# Patient Record
Sex: Female | Born: 1960 | Race: Black or African American | Hispanic: No | Marital: Married | Smoking: Never smoker
Health system: Southern US, Community
[De-identification: ages and names within clinical notes are randomized; demographics above are authoritative.]

## PROBLEM LIST (undated history)

## (undated) DIAGNOSIS — F32A Depression, unspecified: Secondary | ICD-10-CM

## (undated) DIAGNOSIS — R0602 Shortness of breath: Secondary | ICD-10-CM

## (undated) DIAGNOSIS — E785 Hyperlipidemia, unspecified: Secondary | ICD-10-CM

## (undated) DIAGNOSIS — M1990 Unspecified osteoarthritis, unspecified site: Secondary | ICD-10-CM

## (undated) DIAGNOSIS — I10 Essential (primary) hypertension: Secondary | ICD-10-CM

## (undated) DIAGNOSIS — F419 Anxiety disorder, unspecified: Secondary | ICD-10-CM

## (undated) DIAGNOSIS — F329 Major depressive disorder, single episode, unspecified: Secondary | ICD-10-CM

## (undated) DIAGNOSIS — K219 Gastro-esophageal reflux disease without esophagitis: Secondary | ICD-10-CM

## (undated) HISTORY — PX: REPLACEMENT TOTAL KNEE: SUR1224

## (undated) HISTORY — PX: CARPAL TUNNEL RELEASE: SHX101

## (undated) HISTORY — PX: APPENDECTOMY: SHX54

## (undated) HISTORY — PX: KNEE ARTHROSCOPY: SUR90

## (undated) HISTORY — PX: JOINT REPLACEMENT: SHX530

## (undated) HISTORY — PX: ABDOMINAL HYSTERECTOMY: SHX81

## (undated) HISTORY — PX: BUNIONECTOMY: SHX129

---

## 2002-04-13 ENCOUNTER — Ambulatory Visit (HOSPITAL_BASED_OUTPATIENT_CLINIC_OR_DEPARTMENT_OTHER): Admission: RE | Admit: 2002-04-13 | Discharge: 2002-04-13 | Payer: Self-pay | Admitting: Orthopedic Surgery

## 2003-02-03 HISTORY — PX: SHOULDER ARTHROSCOPY: SHX128

## 2003-12-26 ENCOUNTER — Ambulatory Visit (HOSPITAL_COMMUNITY): Admission: RE | Admit: 2003-12-26 | Discharge: 2003-12-26 | Payer: Self-pay | Admitting: Orthopedic Surgery

## 2004-02-19 ENCOUNTER — Ambulatory Visit (HOSPITAL_BASED_OUTPATIENT_CLINIC_OR_DEPARTMENT_OTHER): Admission: RE | Admit: 2004-02-19 | Discharge: 2004-02-19 | Payer: Self-pay | Admitting: Orthopedic Surgery

## 2004-02-19 ENCOUNTER — Ambulatory Visit (HOSPITAL_COMMUNITY): Admission: RE | Admit: 2004-02-19 | Discharge: 2004-02-19 | Payer: Self-pay | Admitting: Orthopedic Surgery

## 2004-04-29 ENCOUNTER — Ambulatory Visit (HOSPITAL_COMMUNITY): Admission: RE | Admit: 2004-04-29 | Discharge: 2004-04-29 | Payer: Self-pay | Admitting: Orthopedic Surgery

## 2004-04-29 ENCOUNTER — Ambulatory Visit (HOSPITAL_BASED_OUTPATIENT_CLINIC_OR_DEPARTMENT_OTHER): Admission: RE | Admit: 2004-04-29 | Discharge: 2004-04-29 | Payer: Self-pay | Admitting: Orthopedic Surgery

## 2004-04-29 ENCOUNTER — Encounter (INDEPENDENT_AMBULATORY_CARE_PROVIDER_SITE_OTHER): Payer: Self-pay | Admitting: *Deleted

## 2005-03-27 ENCOUNTER — Ambulatory Visit (HOSPITAL_COMMUNITY): Admission: RE | Admit: 2005-03-27 | Discharge: 2005-03-27 | Payer: Self-pay | Admitting: Orthopedic Surgery

## 2005-05-21 ENCOUNTER — Ambulatory Visit (HOSPITAL_BASED_OUTPATIENT_CLINIC_OR_DEPARTMENT_OTHER): Admission: RE | Admit: 2005-05-21 | Discharge: 2005-05-22 | Payer: Self-pay | Admitting: Orthopedic Surgery

## 2008-11-07 ENCOUNTER — Encounter: Admission: RE | Admit: 2008-11-07 | Discharge: 2008-11-07 | Payer: Self-pay | Admitting: Orthopedic Surgery

## 2008-11-12 ENCOUNTER — Inpatient Hospital Stay (HOSPITAL_COMMUNITY): Admission: RE | Admit: 2008-11-12 | Discharge: 2008-11-14 | Payer: Self-pay | Admitting: Orthopedic Surgery

## 2010-02-04 IMAGING — CT CT CHEST W/O CM
2 of 3 series · 15 of 36 positions shown, 18 images · non-contrast
Comparison: Chest x-ray 11/06/2008

CLINICAL DATA: Possible pulmonary nodule seen on preoperative chest
x-ray.

CT CHEST WITHOUT CONTRAST
TECHNIQUE: Multidetector CT imaging of the chest was performed
following the standard protocol without IV contrast.

[Series 2: routine chest · axial · 0.70mm/px · z∈[-184,+12]mm · 12 of 47 slices shown, 15 images]
[im 4/47  mediastinal]
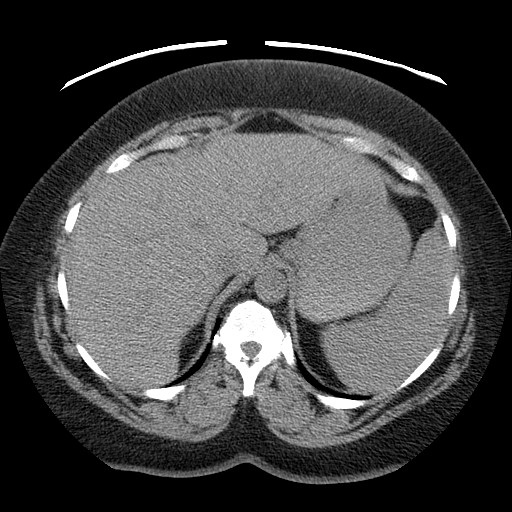
[im 4/47  lung]
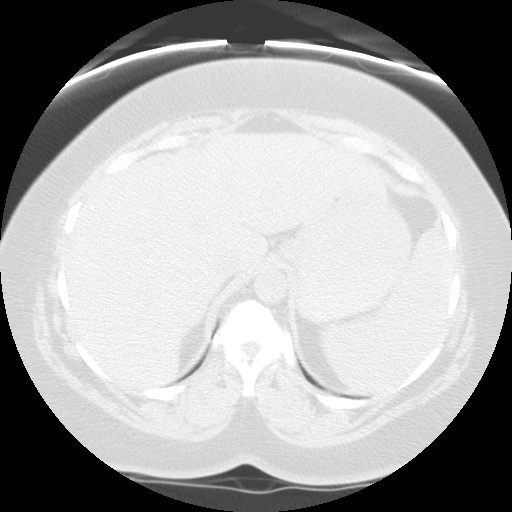
[im 7/47  lung]
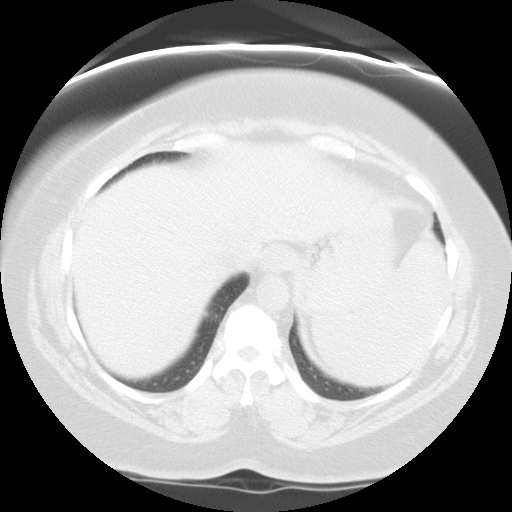
[im 11/47  lung]
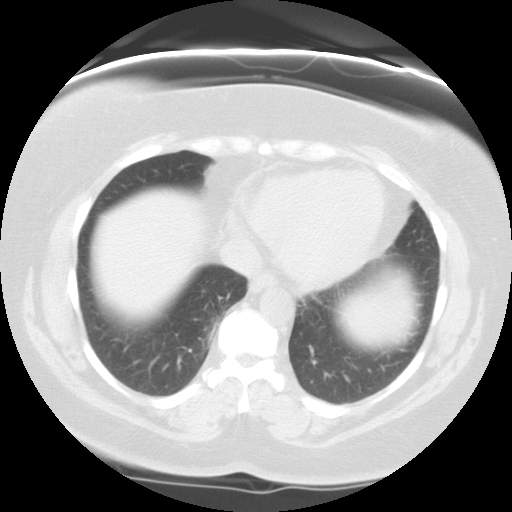
[im 14/47  lung]
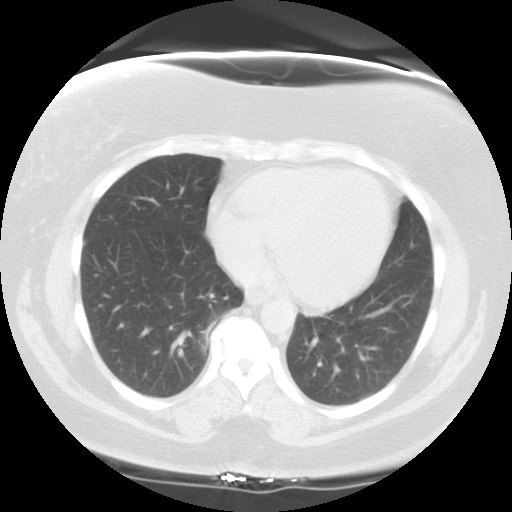
[im 18/47  mediastinal]
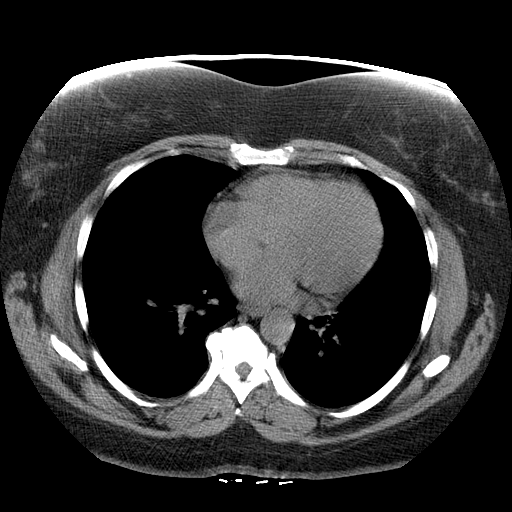
[im 18/47  lung]
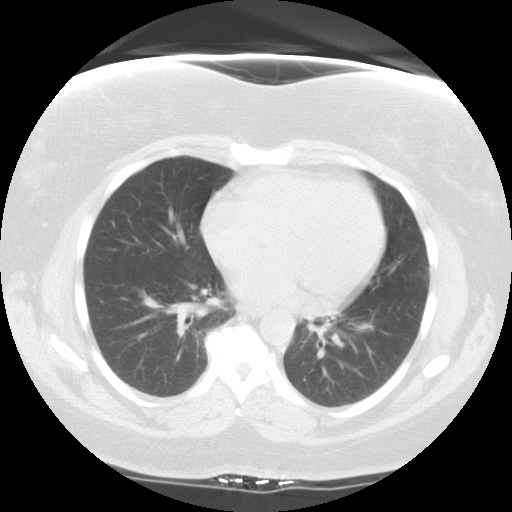
[im 21/47  lung]
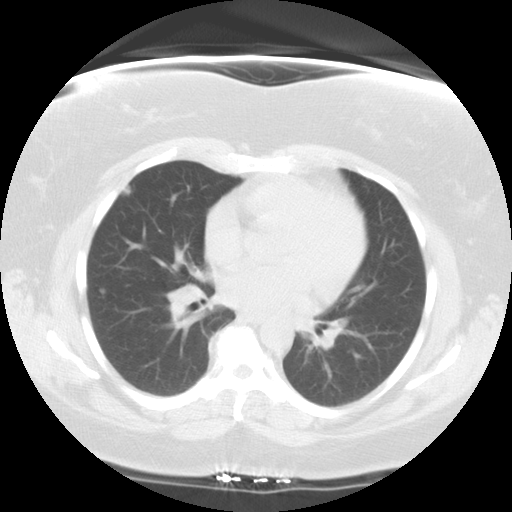
[im 26/47  lung]
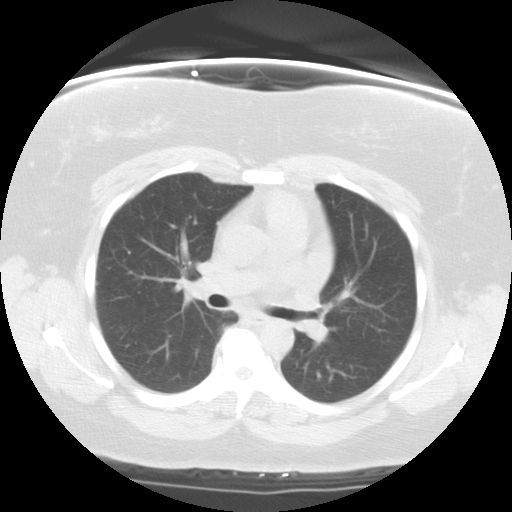
[im 29/47  lung]
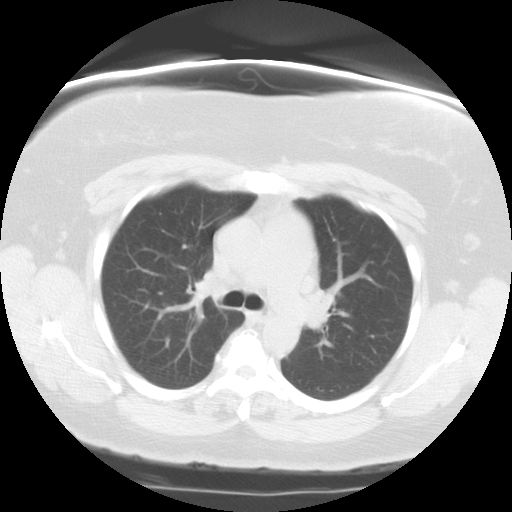
[im 33/47  mediastinal]
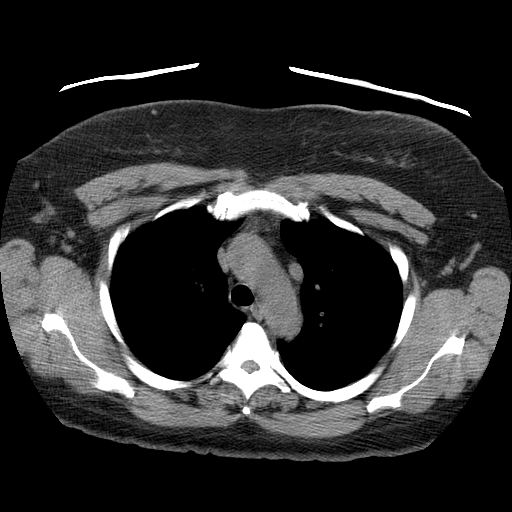
[im 33/47  lung]
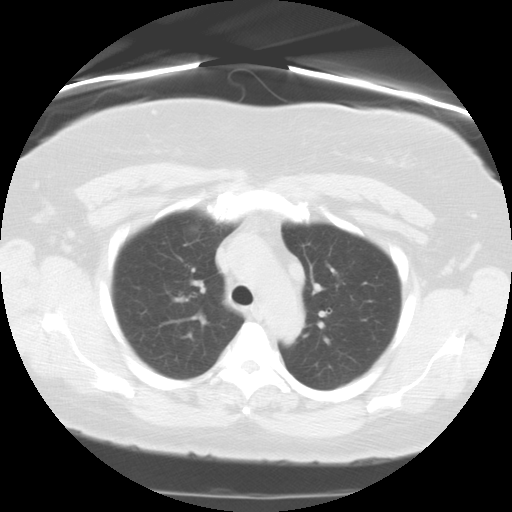
[im 36/47  lung]
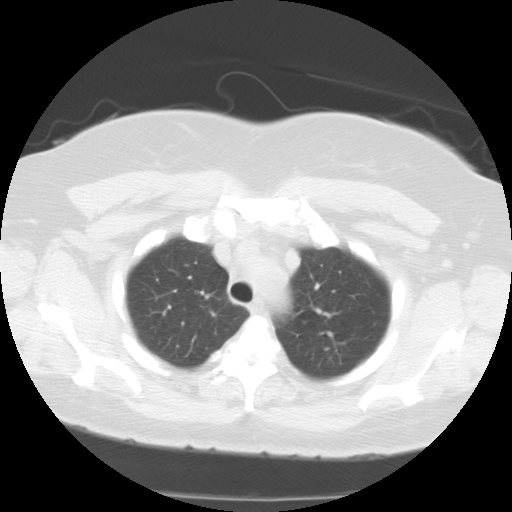
[im 40/47  lung]
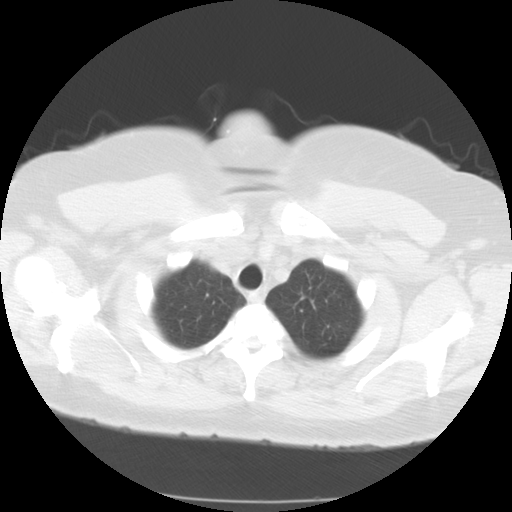
[im 43/47  lung]
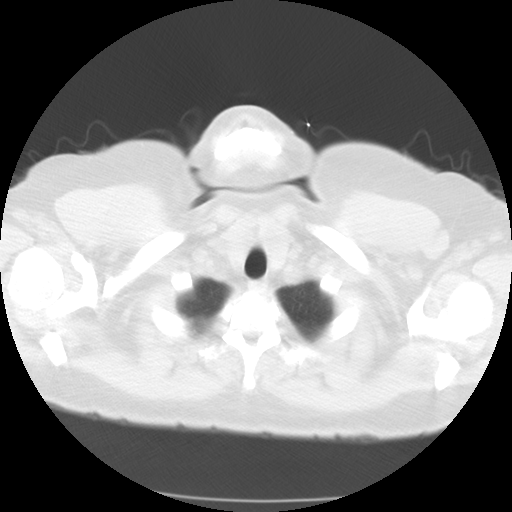

[Series 401: coronal · coronal · 0.70mm/px · 3 of 100 slices shown]
[im 20/100  lung]
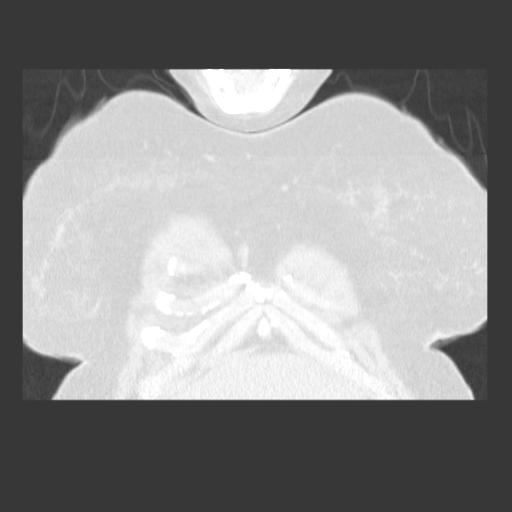
[im 40/100  lung]
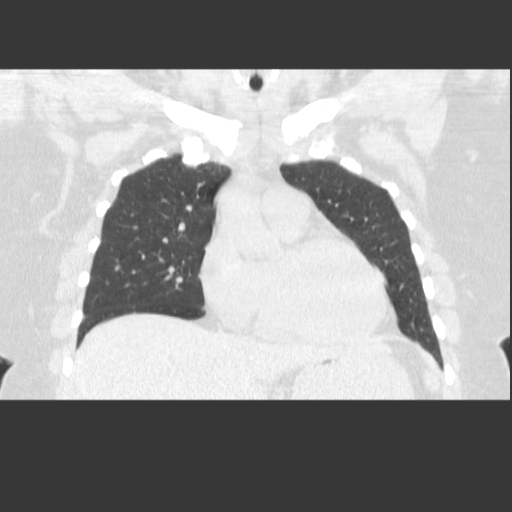
[im 60/100  lung]
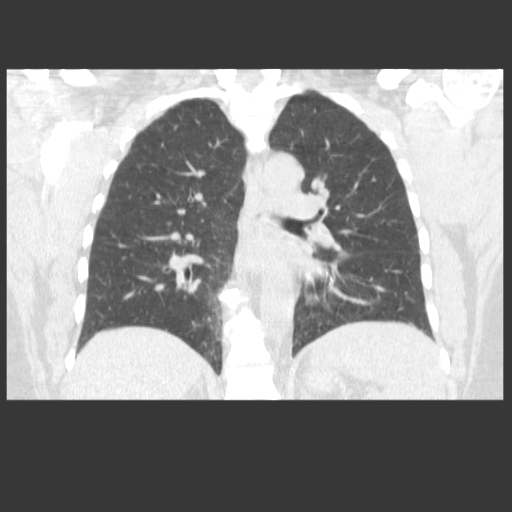

[15 of 36 positions shown; findings below may reference images not displayed]

FINDINGS: No pathologically enlarged mediastinal, hilar or axillary
lymph nodes.  Pulmonary arteries appear enlarged.  Heart size
normal.  No pericardial effusion.

Subpleural nodules are seen along the minor and right major
fissures, and measure up to 9 mm.  The largest nodule has a
triangular polygonal configuration on reformatted images and likely
represents a subpleural lymph node.  No pleural fluid.  Airway is
unremarkable.

Incidental imaging of the upper abdomen shows no acute findings.
No worrisome lytic or sclerotic lesions.
IMPRESSION: Perifissural nodules in the right lung measure up to 9 mm, and
likely represent subpleural lymph nodes.  Please see discussion
above.  Given the size of the largest nodule, follow-up in 3 months
is recommended.

## 2010-05-08 LAB — GLUCOSE, CAPILLARY
Glucose-Capillary: 101 mg/dL — ABNORMAL HIGH (ref 70–99)
Glucose-Capillary: 103 mg/dL — ABNORMAL HIGH (ref 70–99)
Glucose-Capillary: 112 mg/dL — ABNORMAL HIGH (ref 70–99)
Glucose-Capillary: 116 mg/dL — ABNORMAL HIGH (ref 70–99)
Glucose-Capillary: 117 mg/dL — ABNORMAL HIGH (ref 70–99)
Glucose-Capillary: 121 mg/dL — ABNORMAL HIGH (ref 70–99)
Glucose-Capillary: 125 mg/dL — ABNORMAL HIGH (ref 70–99)
Glucose-Capillary: 131 mg/dL — ABNORMAL HIGH (ref 70–99)

## 2010-05-08 LAB — PROTIME-INR
INR: 0.93 (ref 0.00–1.49)
INR: 1.09 (ref 0.00–1.49)
Prothrombin Time: 12.4 seconds (ref 11.6–15.2)
Prothrombin Time: 14 seconds (ref 11.6–15.2)

## 2010-05-08 LAB — HEMOGLOBIN A1C
Hgb A1c MFr Bld: 5.8 % (ref 4.6–6.1)
Hgb A1c MFr Bld: 5.9 % (ref 4.6–6.1)
Mean Plasma Glucose: 120 mg/dL
Mean Plasma Glucose: 123 mg/dL

## 2010-05-08 LAB — CBC
HCT: 27.5 % — ABNORMAL LOW (ref 36.0–46.0)
HCT: 28.5 % — ABNORMAL LOW (ref 36.0–46.0)
HCT: 36.9 % (ref 36.0–46.0)
Hemoglobin: 12.4 g/dL (ref 12.0–15.0)
Hemoglobin: 9.1 g/dL — ABNORMAL LOW (ref 12.0–15.0)
Hemoglobin: 9.4 g/dL — ABNORMAL LOW (ref 12.0–15.0)
MCHC: 33 g/dL (ref 30.0–36.0)
MCHC: 33.5 g/dL (ref 30.0–36.0)
MCV: 85.9 fL (ref 78.0–100.0)
MCV: 86 fL (ref 78.0–100.0)
Platelets: 195 10*3/uL (ref 150–400)
Platelets: 297 10*3/uL (ref 150–400)
RBC: 3.32 MIL/uL — ABNORMAL LOW (ref 3.87–5.11)
RBC: 4.29 MIL/uL (ref 3.87–5.11)
RDW: 14.8 % (ref 11.5–15.5)
RDW: 15.1 % (ref 11.5–15.5)
WBC: 5.8 10*3/uL (ref 4.0–10.5)
WBC: 7.6 10*3/uL (ref 4.0–10.5)
WBC: 8.6 10*3/uL (ref 4.0–10.5)

## 2010-05-08 LAB — BASIC METABOLIC PANEL
BUN: 9 mg/dL (ref 6–23)
CO2: 29 mEq/L (ref 19–32)
Calcium: 8.3 mg/dL — ABNORMAL LOW (ref 8.4–10.5)
Chloride: 99 mEq/L (ref 96–112)
Creatinine, Ser: 1.04 mg/dL (ref 0.4–1.2)
GFR calc Af Amer: >60 mL/min (ref >60)
GFR calc non Af Amer: 56 mL/min — ABNORMAL LOW (ref >60)
GFR calc non Af Amer: >60 mL/min (ref >60)
Glucose, Bld: 117 mg/dL — ABNORMAL HIGH (ref 70–99)
Potassium: 4 mEq/L (ref 3.5–5.1)
Potassium: 4.2 mEq/L (ref 3.5–5.1)
Sodium: 135 mEq/L (ref 135–145)
Sodium: 138 mEq/L (ref 135–145)

## 2010-05-08 LAB — COMPREHENSIVE METABOLIC PANEL
ALT: 18 U/L (ref 0–35)
AST: 23 U/L (ref 0–37)
Albumin: 4 g/dL (ref 3.5–5.2)
Alkaline Phosphatase: 86 U/L (ref 39–117)
BUN: 5 mg/dL — ABNORMAL LOW (ref 6–23)
CO2: 32 mEq/L (ref 19–32)
Calcium: 10.2 mg/dL (ref 8.4–10.5)
Chloride: 99 mEq/L (ref 96–112)
Creatinine, Ser: 0.9 mg/dL (ref 0.4–1.2)
GFR calc Af Amer: >60 mL/min (ref >60)
GFR calc non Af Amer: >60 mL/min (ref >60)
Glucose, Bld: 76 mg/dL (ref 70–99)
Potassium: 4.2 mEq/L (ref 3.5–5.1)
Sodium: 140 mEq/L (ref 135–145)
Total Bilirubin: 0.5 mg/dL (ref 0.3–1.2)
Total Protein: 7 g/dL (ref 6.0–8.3)

## 2010-05-08 LAB — DIFFERENTIAL
Basophils Absolute: 0 10*3/uL (ref 0.0–0.1)
Basophils Relative: 0 % (ref 0–1)
Eosinophils Absolute: 0.1 10*3/uL (ref 0.0–0.7)
Eosinophils Relative: 1 % (ref 0–5)
Lymphocytes Relative: 30 % (ref 12–46)
Lymphs Abs: 1.7 10*3/uL (ref 0.7–4.0)
Monocytes Absolute: 0.5 10*3/uL (ref 0.1–1.0)
Monocytes Relative: 9 % (ref 3–12)
Neutro Abs: 3.5 10*3/uL (ref 1.7–7.7)
Neutrophils Relative %: 60 % (ref 43–77)

## 2010-05-08 LAB — TYPE AND SCREEN
ABO/RH(D): O POS
Antibody Screen: NEGATIVE

## 2010-05-08 LAB — URINALYSIS, ROUTINE W REFLEX MICROSCOPIC
Bilirubin Urine: NEGATIVE
Glucose, UA: NEGATIVE mg/dL
Hgb urine dipstick: NEGATIVE
Ketones, ur: NEGATIVE mg/dL
Nitrite: NEGATIVE
Protein, ur: NEGATIVE mg/dL
Specific Gravity, Urine: 1.013 (ref 1.005–1.030)
Urobilinogen, UA: 0.2 mg/dL (ref 0.0–1.0)
pH: 7.5 (ref 5.0–8.0)

## 2010-05-08 LAB — URINE CULTURE: Colony Count: 2000

## 2010-05-08 LAB — APTT: aPTT: 29 seconds (ref 24–37)

## 2010-05-08 LAB — ABO/RH: ABO/RH(D): O POS

## 2010-06-20 NOTE — Op Note (Signed)
NAMELAURAH, [name]            ACCOUNT NO.:  [id number]   MEDICAL RECORD NO.:  [id number]          PATIENT TYPE:  AMB   LOCATION:  DSC                          FACILITY:  [facility]   PHYSICIAN:  Katy Fitch. Sypher Montez Hageman., M.D.DATE OF BIRTH:  [birth year]   DATE OF PROCEDURE:  04/29/2004  DATE OF DISCHARGE:                                 OPERATIVE REPORT   PREOPERATIVE DIAGNOSIS:  Volar left ganglion.   POSTOPERATIVE DIAGNOSIS:  Subfascial left volar ganglion involving the vena  comitantes and adventitia of radial artery.   OPERATIONS:  Resection of left volar ganglion with arthrotomy of radiocarpal  articulation for debridement of ganglion.   OPERATING SURGEON:  Katy Fitch. Sypher, M.D.   ASSISTANT:  Marveen Reeks. Dasnoit, P.A.-C.   ANESTHESIA:  General by LMA.   SUPERVISING ANESTHESIOLOGIST:  Dr. Adonis Huguenin.   INDICATIONS:  Veronica Conner is a 50 year old woman who has presented for  evaluation and management of a shoulder impingement process and a painful  left wrist. She is status post arthroscopic management of her shoulder and  requested removal of a painful mass along the volar radial aspect of her  left wrist.   On exam she had a mass measuring approximately 2 cm in length and 1.5 cm in  width that appeared be subfascial adjacent to the bifurcation of the radial  artery. Plain films were nondiagnostic.  Clinically this appeared to be a  typical volar ganglion associated with the bifurcation the radial artery.  After informed consent, she is brought to the operating this time for  resection.   Preoperatively she was advised of our poor understanding of the ganglion  process. She understands that there is about a 5% chance of recurrence over  the next 2 years in this region.  Questions were invited, answered  preoperatively.   PROCEDURE:  Claressa Conner was brought to the operating room and placed in  supine position on the operating table.  Following induction of general  anesthesia by LMA technique, the left arm was prepped with Betadine soap and  solution, sterilely draped. A pneumatic tourniquet spot proximal brachium.  Following exsanguination of the left arm with an Esmarch bandage, the  arterial tourniquet was inflated to 220 mmHg.   Procedure commenced with a short transverse incision directly over the  palpable mass.  The subcutaneous tissue were carefully divided, taking care  to identify the radial artery proper proximal to the mass. The artery was  dissected distally and the wall of the cyst appeared to be intimately  involved the adventitia of the radial artery. There was carefully dissected  free of the cyst wall and retracted with a blunt retractor. This cyst was  circumferentially dissected and was noted to involve the vena comitantes of  the radial artery and superficial branch the radial artery intimately.   The cyst was circumferentially dissected, drained of its contents and  followed to the capsule the wrist joint. The cyst was amputated off the  volar aspect of the radial scapholunate and  radial scaphocapitate ligament  juncture. The joint was entered and the cyst wall removed directly to the  level of the radiocarpal articulation.  The wound was then irrigated and  electrocautery was used to seal lumens of blood vessels visualized.  The  arterial tourniquet released. No significant bleeding was encountered.  The  wound was then repaired with intradermal 3-0 Prolene and Steri-Stripped. A  compressive dressing was applied with a volar plaster splint maintaining the  wrist in 5 degrees of dorsiflexion.   We have advised [name] to elevate her hand for 48 hours. She will  begin immediate finger range of motion exercises. She will return to our  office for follow-up in a week for dressing change. We anticipate suture  removal in 10 to 14 days.   For aftercare, she is given a prescription for Percocet 5 milligrams one  p.o. q. 4  to 6 hours p.r.n. pain, 20 tablets without refill.      RVS/MEDQ  D:  04/29/2004  T:  04/29/2004  Job:  [id number]

## 2010-06-20 NOTE — Op Note (Signed)
Veronica Conner, Veronica Conner                        ACCOUNT NO.:  [id number]   MEDICAL RECORD NO.:  [id number]                   PATIENT TYPE:  AMB   LOCATION:  DSC                                  FACILITY:  [facility]   PHYSICIAN:  Katy Fitch. Naaman Plummer., M.D.          DATE OF BIRTH:  09/12/1960   DATE OF PROCEDURE:  04/13/2002  DATE OF DISCHARGE:                                 OPERATIVE REPORT   PREOPERATIVE DIAGNOSES:  1. 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  2. Degenerative arthritis, right thumb carpometacarpal joint.   POSTOPERATIVE DIAGNOSES:  1. 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  2. Degenerative arthritis, right thumb carpometacarpal joint.   PROCEDURES:  1. Release of right thumb A1 pulley, 40981-[id number].  2. Injection of right thumb carpometacarpal joint with 1% lidocaine, 0.25%     Marcaine, and 0.5 mL of Depo-Medrol 40 mg/mL.   SURGEON:  Katy Fitch. Sypher, M.D.   ASSISTANT:  Jonni Sanger, P.A.   ANESTHESIA:  0.25% Marcaine and 2% lidocaine metacarpal-head level block of  right thumb, supplemented by IV sedation supervised by the anesthesiologist,  Guadalupe Maple, M.D.   INDICATIONS:  The patient is a 50 year old woman who presented for  evaluation and management of a painful thumb and numbness in her hands.   Clinical examination suggested a possible bilateral carpal tunnel syndrome.  She had signs of CMC arthrosis at the base of her right thumb and active  stenosing tenosynovitis of her right thumb at the A1 pulley.   Electrodiagnostic studies completed by Laurier Nancy, M.D., revealed  no sign of entrapment neuropathy of the median or ulnar nerves.   She was noted to have some moderate degenerative disk disease; therefore,  her numbness in her hands may be related to her neck predicament.   After informed consent, she is brought to the operating room at this time  for release of her right thumb A1 pulley and  injection of her right thumb  CMC joint to try to relieve her pain.   DESCRIPTION OF PROCEDURE:  The patient was brought to the operating room and  placed in supine position on the operating table.   Following light sedation, the right arm was prepped with Betadine soap and  solution and sterilely draped.   Following exsanguination of the limb with an Esmarch bandage, an arterial  tourniquet on the proximal brachium was inflated to 220 mmHg.   The procedure commenced with injection of the [facility] joint with a mixture of 1%  lidocaine, 0.25% Marcaine, and 0.5 mL of a 40 mg/mL preparation of Depo-  Medrol.   An injection of the CMC capsule was accomplished.   Attention was then directed to the thumb overlying the A1 pulley.   A transverse incision was fashioned directly over the palpably enlarged  pulley.   Subcutaneous tissues were divided, taking care to identify the A1 pulley.   Retractors  were gently placed, protecting the radial proper digital nerve,  followed by release of the A1 pulley with a scalpel and scissors.  Thereafter, full range of motion of the IP joint of the thumb was recovered.   There were no apparent complications.   The wound was irrigated and repaired with mattress sutures of 4-0 nylon.   A compressive dressing was applied with Xeroflo, sterile gauze, and Ace  wrap.   The patient was awakened from sedation and transferred to the recovery room  with stable vital signs.                                                Katy Fitch Naaman Plummer., M.D.    RVS/MEDQ  D:  04/13/2002  T:  04/13/2002  Job:  [id number]

## 2010-06-20 NOTE — Op Note (Signed)
NAMEJOELY, Veronica Conner            ACCOUNT NO.:  [id number]   MEDICAL RECORD NO.:  [id number]          PATIENT TYPE:  AMB   LOCATION:  DSC                          FACILITY:  [facility]   PHYSICIAN:  Katy Fitch. 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  An MRI of the rotator cuff suggested significant  tendinopathy of the supraspinatus and anterior infraspinatus tendons without  signs of retracted rotator cuff tear.   POSTOPERATIVE DIAGNOSIS:  Signs of chronic sub-acromioclavicular joint  impingement with severe bursitis, subacromial space, with prominence of  anterior acromion and coracoacromial ligament.  There was no significant of  retracted rotator cuff tear.   OPERATION:  1.  Diagnostic arthroscopy, right glenohumeral joint.  2.  Arthroscopic subacromial decompression with coracoacromial ligament      release, acromioplasty and bursectomy.  3.  Arthroscopic distal clavicle resection.   OPERATING SURGEON:  Katy Fitch. Sypher, M.D.   ASSISTANT:  Jonni Sanger, P.A.   ANESTHESIA:  General endotracheal supplemented by interscalen block.   SUPERVISING ANESTHESIOLOGIST:  Janetta Hora. Gelene Mink, M.D.   INDICATIONS:  Veronica Conner is a 50 year old woman employed by [employer].  She has a history of chronic right shoulder pain.  Clinical  examination suggested AC arthropathy with impingement and possible rotator  cuff degenerative tearing.   An MRI of the shoulder revealed evidence of tendinopathy in the anterior  supraspinatus and a portion of the anterior infraspinatus without signs of  retracted rotator cuff tear.  There was a very  prominent anterior capsule of  the AC joint.   Due to a failure to respond to nonoperative measures, Veronica Conner is  brought to the operating room at this time, anticipating arthroscopic  evaluation of her right glenohumeral joint, arthroscopic subacromial  decompression and probable open distal clavicle resection.   After informed consent, during which questions were invited and answered,  she was brought to the operating room at this time.   PROCEDURE:  Veronica Conner was interviewed in room 7 at the [facility] preop area, where her right shoulder was identified at the proper  operative site.  Following informed consent, during which questions were  invited and answered, she was transported to room 6 at the Day Surgery  Center, where, under the supervision of Dr. Gelene Mink, general orotracheal  anesthesia was induced.  She was subsequently positioned in the beach chair  position with the aid of a torso and head holder designed for shoulder  arthroscopy.  The entire right upper extremity and forequarter were prepped  with DuraPrep and draped with impervious arthroscopy drapes.   One gram of Ancef was administered as an IV prophylactic antibiotic.   Procedure commenced with distention of the right shoulder with 20 mL of  sterile saline placed with a spinal needle anteriorly.  The scope was placed atraumatically through a standard posterior portal.  Diagnostic arthroscopy revealed intact hyaline and articular cartilage  surfaces on the glenoid and humeral head.  The anterior capsular ligaments  were normal.  The deep surface of the subscapularis, supraspinatus,  infraspinatus and teres minor were well-visualized and found to be intact.   There was no sign of a partial or full-thickness rotator cuff tear within  the joint.   The biceps tendon was stable at its origin and normal through the rotator  interval.  The inferior recess was normal.   The scope was removed  from the glenohumeral joint and placed in the  subacromial space.  An abundant bursitis was noted.   The anatomy of the coracoacromial arch was identified, followed by  identification of the prominent inferior capsule of the [facility] joint.   The capsule of the [facility] joint was taken down with the cutting cautery,  followed by release of the coracoacromial ligament and hemostasis with the  radiofrequency probe.   The coracoacromial ligament was partially resected, followed by hemostasis.  The acromion was leveled to a type I morphology with a suction bur.  The  distal 15 mm of clavicle were removed with a suction bur without difficulty.   The bursal side of the rotator cuff was inspected and found to be intact  with a normal vascular pattern.   It appears that the tendinopathy noted on the MRI is mid-substance and may  represent a degenerative process, or could be due to impingement beneath the  [facility] joint.   Our goal would be allow to Veronica Conner's shoulder to heal with a therapy  program for 3-6 months.   She has been provided a family medical leave recommendation to be out of  work approximately 12 weeks.   There were no apparent complications during the procedure.  She was awakened  from general anesthesia and transferred to the recovery room with stable  vital signs.  She will be observed in the recovery care center for 24 hours,  as she is not a resident of [place].      RVS/MEDQ  D:  02/19/2004  T:  02/19/2004  Job:  [id number]

## 2010-06-20 NOTE — Op Note (Signed)
NAMEAHMANI, POEPPELMAN            ACCOUNT NO.:  [id number]   MEDICAL RECORD NO.:  [id number]          PATIENT TYPE:  AMB   LOCATION:  DSC                          FACILITY:  [facility]   PHYSICIAN:  Katy Fitch. Sypher, M.D. DATE OF BIRTH:  May 13, 1960   DATE OF PROCEDURE:  05/21/2005  DATE OF DISCHARGE:                                 OPERATIVE REPORT   PREOPERATIVE DIAGNOSIS:  Chronic left shoulder pain due to stage III  impingement with plain film evidence of acromioclavicular arthropathy and an  unfavorable type 3 acromion with ossification of the coracoacromial  ligament.   POSTOPERATIVE DIAGNOSES:  1.  Acromioclavicular degenerative arthritis.  2.  Ossification of coracoacromial ligament.  3.  Ninety-five percent thickness supraspinatus retracted rotator cuff tear      and degenerative labral tear, anteriorly, superiorly and posteriorly.   OPERATION:  1.  Diagnostic arthroscopy of left glenohumeral joint.  2.  Arthroscopic debridement of degenerative labral fragments.  3.  Arthroscopic subacromial decompression with bursectomy, coracoacromial      ligament release and acromioplasty.  4.  Open resection of distal clavicle.  5.  Open reconstruction of supraspinatus retracted rotator cuff tear.   OPERATING SURGEON:  Katy Fitch. Sypher, M.D.   ASSISTANT:  Annye Rusk, P.A.-C.   ANESTHESIA:  General endotracheal supplemented by interscalene block.   SUPERVISING ANESTHESIOLOGIST:  Zenon Mayo, M.D.   INDICATIONS:  Chelcee Gulbrandson is a 50 year old woman with a history of type  2 diabetes.  She is status post arthroscopic decompression of her right  shoulder with good result.  She presented for evaluation of a painful left  shoulder.  Clinical examination revealed weakness of abduction with  impingement signs.  Plain films demonstrated unfavorable acromial and AC  anatomy.  An MRI of the shoulder revealed a retracted bursal surface rotator  cuff tear involving the central  supraspinatus.   Due to a failure to respond to nonoperative measures, we recommended that  Ms. Basnett proceed with arthroscopic evaluation of her shoulder,  anticipating arthroscopic subacromial decompression, distal clavicle  resection, bursectomy and repair of the rotator cuff.   We anticipated a glenohumeral arthroscopy and will address and glenohumeral  pathology identified.   After informed consent, she is brought to the operating room at this time.   PROCEDURE:  Chyan Lage was seen by Dr. Sampson Goon for preoperative  anesthesia consult.  After informed consent, he placed a right interscalene  block.   After 30 minutes, anesthesia was uncertain in the left forequarter.   Ms. Newitt was transferred to the operating room and placed in supine  position upon the operating table.   General endotracheal anesthesia was induced followed by positioning of Ms.  Northway in the beach-chair position with the aid of a torso and  headholder designed for shoulder arthroscopy.   The entire left arm and forequarter were prepped with DuraPrep and draped  with impervious arthroscopy drapes.   Procedure commenced with distention of the shoulder with 20 mL of sterile  saline brought in with a anterior spinal needle approach.   The scope was placed through a standard posterior viewing portal  with blunt  technique.  Diagnostic arthroscopy revealed intact hyaline articular  cartilage surfaces on the glenoid and humeral head. The labrum was intact  anteriorly, inferiorly and inferoposteriorly.  There was degenerative labral  tearing anterosuperiorly, superiorly and posteriorly to about 10 o'clock.  Biceps anchor was sound.  The biceps tendon was normal through the rotator  interval.  The subscapularis tendon appeared normal.  The deep surface of  the supraspinatus and infraspinatus had an intact insertion at the joint  margin.   The suction shaver was introduced under direct  vision through an anterior  portal and the labral debris removed with gentle dissection.  Photographic  documentation of predicament was accomplished.   The scope was then removed and placed in the subacromial space.  After a  thorough bursectomy, the anatomy the coracoacromial arch was studied.   There was a very prominent type 3 anterior acromion due to ossification of  the coracoacromial ligament insertion.  The AC capsule was hypertrophic with  a large inferior-projecting osteophyte posteriorly.   The Arthrocare wand was used to take down the soft tissues and release the  coracoacromial ligament.  Hemostasis in the bursa was achieved with the  bipolar cautery.  The acromion was leveled with an arthroscopic suction bur  to a type 1 morphology.   Due to Mr. Tranchina's rather large body mass index, we were unable to  easily proceed with arthroscopic distal clavicle resection; therefore we  examined the rotator cuff carefully and identified a 3-cm-wide 1.5-cm  retracted 95% thickness bursal side tear of the supraspinatus deep to the  anterior acromial spur.   This was documented with a digital camera followed by conversion of the  procedure to an open rotator cuff repair.   A 4-cm incision was fashioned from the [facility] joint across the anterior  acromion.   The capsule was elevated off the anterior acromion and the coracoacromial  ligament identified and tagged.  The bursa was lysed with digital dissection  followed by further bursectomy.  The margins of the cuff tear of the  supraspinatus were freshened followed by use of a suction bur to lower the  profile of the greater tuberosity down to bleeding cancellus bone with  removal of approximately 3 mm of reactive osteophyte.  Further hand rasping  of the anterior and lateral acromion smoothed the margins.  The ossified  portion the acromion was removed with a rongeur.  The distal 15 mm of clavicle were exposed subperiosteally with a  small  osteotome followed by use of an oscillating saw to remove the clavicle.  There was a satisfactory fat pad remaining deep to the [facility] joint.   The cuff tear was repaired by debridement with a suction shaver of the  margin of the tear, followed by placement of a McLaughlin grasping suture  that was advanced through drill holes to the lateral cortex of the greater  tuberosity.  A single Bio-Corkscrew anchor was placed at the medial  footprint of the repair and 2 mattress sutures were placed about 15 mm from  the lateral margin of the greater tuberosity.   The McLaughlin suture was tensioned with the arm in abduction, insetting the  tear to a smooth margin, followed by use of the mattress sutures to inset  the very satisfactory footprint for the supraspinatus repair to bleeding  cancellus bone.   The subacromial space was thoroughly lavage with sterile saline and the  anterior third of deltoid repaired to the trapezius muscle, closing the  dead  space created by distal clavicle resection, followed by meticulous repair of  the anterior deltoid tendon to the acromion with periosteal and grasping  muscle sutures.  With the muscle was split laterally, it was less than 1.5  cm and was repaired with a mattress suture of 0 Vicryl.   The wound was again lavaged with sterile saline followed by repair with a  subdermal suture of 2-0 Vicryl and intradermal 3-0 Prolene with Steri-  Strips.   Marcaine 0.25% was infiltrated into the subacromial space and into the skin  margins for postoperative analgesia.   There were no apparent complications.  Veronica Conner tolerated surgery and  anesthesia well.  She was transferred to the recovery room with stable  signs.      Katy Fitch Sypher, M.D.  Electronically Signed     RVS/MEDQ  D:  05/21/2005  T:  05/22/2005  Job:  [id number]

## 2011-01-13 ENCOUNTER — Other Ambulatory Visit: Payer: Self-pay | Admitting: Orthopedic Surgery

## 2011-01-16 ENCOUNTER — Encounter (HOSPITAL_BASED_OUTPATIENT_CLINIC_OR_DEPARTMENT_OTHER): Payer: Self-pay | Admitting: *Deleted

## 2011-01-16 NOTE — Progress Notes (Signed)
Coming in for cxr,ekg,lab-to bring all meds-overnight bag Denies osa

## 2011-01-19 ENCOUNTER — Ambulatory Visit
Admission: RE | Admit: 2011-01-19 | Discharge: 2011-01-19 | Disposition: A | Discharge status: Home / Self Care | Payer: Managed Care, Other (non HMO) | Source: Ambulatory Visit | Attending: Anesthesiology | Admitting: Anesthesiology

## 2011-01-19 ENCOUNTER — Encounter (HOSPITAL_BASED_OUTPATIENT_CLINIC_OR_DEPARTMENT_OTHER)
Admission: RE | Admit: 2011-01-19 | Discharge: 2011-01-19 | Disposition: A | Discharge status: Home / Self Care | Payer: Managed Care, Other (non HMO) | Source: Ambulatory Visit | Attending: Orthopedic Surgery | Admitting: Orthopedic Surgery

## 2011-01-19 ENCOUNTER — Other Ambulatory Visit: Payer: Self-pay

## 2011-01-19 LAB — BASIC METABOLIC PANEL WITH GFR
BUN: 12 mg/dL (ref 6–23)
CO2: 28 meq/L (ref 19–32)
Calcium: 9.8 mg/dL (ref 8.4–10.5)
Chloride: 100 meq/L (ref 96–112)
Creatinine, Ser: 0.85 mg/dL (ref 0.50–1.10)
GFR calc Af Amer: 90 mL/min — ABNORMAL LOW
GFR calc non Af Amer: 77 mL/min — ABNORMAL LOW
Glucose, Bld: 140 mg/dL — ABNORMAL HIGH (ref 70–99)
Potassium: 4 meq/L (ref 3.5–5.1)
Sodium: 138 meq/L (ref 135–145)

## 2011-01-20 ENCOUNTER — Encounter (HOSPITAL_BASED_OUTPATIENT_CLINIC_OR_DEPARTMENT_OTHER): Payer: Self-pay | Admitting: Orthopedic Surgery

## 2011-01-20 ENCOUNTER — Encounter (HOSPITAL_BASED_OUTPATIENT_CLINIC_OR_DEPARTMENT_OTHER): Payer: Self-pay

## 2011-01-20 ENCOUNTER — Encounter (HOSPITAL_BASED_OUTPATIENT_CLINIC_OR_DEPARTMENT_OTHER): Payer: Self-pay | Admitting: Anesthesiology

## 2011-01-20 ENCOUNTER — Ambulatory Visit (HOSPITAL_BASED_OUTPATIENT_CLINIC_OR_DEPARTMENT_OTHER): Payer: Managed Care, Other (non HMO) | Admitting: Anesthesiology

## 2011-01-20 ENCOUNTER — Encounter (HOSPITAL_BASED_OUTPATIENT_CLINIC_OR_DEPARTMENT_OTHER)
Admission: RE | Disposition: A | Discharge status: Home / Self Care | Payer: Self-pay | Source: Ambulatory Visit | Attending: Orthopedic Surgery

## 2011-01-20 ENCOUNTER — Ambulatory Visit (HOSPITAL_BASED_OUTPATIENT_CLINIC_OR_DEPARTMENT_OTHER)
Admission: RE | Admit: 2011-01-20 | Discharge: 2011-01-21 | Disposition: A | Discharge status: Home / Self Care | Payer: Managed Care, Other (non HMO) | Source: Ambulatory Visit | Attending: Orthopedic Surgery | Admitting: Orthopedic Surgery

## 2011-01-20 DIAGNOSIS — K219 Gastro-esophageal reflux disease without esophagitis: Secondary | ICD-10-CM | POA: Insufficient documentation

## 2011-01-20 DIAGNOSIS — M19019 Primary osteoarthritis, unspecified shoulder: Secondary | ICD-10-CM | POA: Insufficient documentation

## 2011-01-20 DIAGNOSIS — J45909 Unspecified asthma, uncomplicated: Secondary | ICD-10-CM | POA: Insufficient documentation

## 2011-01-20 DIAGNOSIS — M75120 Complete rotator cuff tear or rupture of unspecified shoulder, not specified as traumatic: Secondary | ICD-10-CM | POA: Insufficient documentation

## 2011-01-20 DIAGNOSIS — Z01810 Encounter for preprocedural cardiovascular examination: Secondary | ICD-10-CM | POA: Insufficient documentation

## 2011-01-20 DIAGNOSIS — E119 Type 2 diabetes mellitus without complications: Secondary | ICD-10-CM | POA: Insufficient documentation

## 2011-01-20 DIAGNOSIS — K08409 Partial loss of teeth, unspecified cause, unspecified class: Secondary | ICD-10-CM | POA: Insufficient documentation

## 2011-01-20 DIAGNOSIS — I10 Essential (primary) hypertension: Secondary | ICD-10-CM | POA: Insufficient documentation

## 2011-01-20 HISTORY — DX: Unspecified osteoarthritis, unspecified site: M19.90

## 2011-01-20 HISTORY — DX: Hyperlipidemia, unspecified: E78.5

## 2011-01-20 HISTORY — DX: Essential (primary) hypertension: I10

## 2011-01-20 HISTORY — DX: Gastro-esophageal reflux disease without esophagitis: K21.9

## 2011-01-20 HISTORY — DX: Depression, unspecified: F32.A

## 2011-01-20 HISTORY — DX: Shortness of breath: R06.02

## 2011-01-20 HISTORY — DX: Anxiety disorder, unspecified: F41.9

## 2011-01-20 HISTORY — DX: Major depressive disorder, single episode, unspecified: F32.9

## 2011-01-20 LAB — GLUCOSE, CAPILLARY
Glucose-Capillary: 90 mg/dL (ref 70–99)
Glucose-Capillary: 113 mg/dL — ABNORMAL HIGH (ref 70–99)

## 2011-01-20 SURGERY — SHOULDER ARTHROSCOPY WITH ROTATOR CUFF REPAIR AND SUBACROMIAL DECOMPRESSION
Anesthesia: General | Laterality: Right | Wound class: Clean

## 2011-01-20 MED ORDER — CHLORHEXIDINE GLUCONATE 4 % EX LIQD: 60.0000 mL | Freq: Once | CUTANEOUS | Status: DC

## 2011-01-20 MED ORDER — SODIUM CHLORIDE 0.9 % IV SOLN
INTRAVENOUS | Status: DC
  Administered 2011-01-20: 19:00:00 via INTRAVENOUS

## 2011-01-20 MED ORDER — CEFAZOLIN SODIUM-DEXTROSE 2-3 GM-% IV SOLR
2.0000 g | Freq: Once | INTRAVENOUS | Status: AC
  Administered 2011-01-20: 2 g via INTRAVENOUS

## 2011-01-20 MED ORDER — HYDROMORPHONE HCL 2 MG PO TABS: ORAL_TABLET | ORAL | Status: AC

## 2011-01-20 MED ORDER — ONDANSETRON HCL 4 MG PO TABS: 4.0000 mg | ORAL_TABLET | Freq: Four times a day (QID) | ORAL | Status: DC | PRN

## 2011-01-20 MED ORDER — FENTANYL CITRATE 0.05 MG/ML IJ SOLN
50.0000 ug | INTRAMUSCULAR | Status: DC | PRN
  Administered 2011-01-20: 50 ug via INTRAVENOUS

## 2011-01-20 MED ORDER — ONDANSETRON HCL 4 MG/2ML IJ SOLN
INTRAMUSCULAR | Status: DC | PRN
  Administered 2011-01-20: 4 mg via INTRAVENOUS

## 2011-01-20 MED ORDER — BUPIVACAINE-EPINEPHRINE PF 0.5-1:200000 % IJ SOLN
INTRAMUSCULAR | Status: DC | PRN
  Administered 2011-01-20: 30 mL

## 2011-01-20 MED ORDER — HYDROMORPHONE HCL 2 MG PO TABS
2.0000 mg | ORAL_TABLET | ORAL | Status: DC | PRN
  Administered 2011-01-20 – 2011-01-21 (×2): 2 mg via ORAL

## 2011-01-20 MED ORDER — ACETAMINOPHEN 650 MG RE SUPP: 650.0000 mg | Freq: Four times a day (QID) | RECTAL | Status: DC | PRN

## 2011-01-20 MED ORDER — SODIUM CHLORIDE 0.9 % IR SOLN
Status: DC | PRN
  Administered 2011-01-20 (×5): 6000 mL

## 2011-01-20 MED ORDER — CEFAZOLIN SODIUM 1-5 GM-% IV SOLN: 1.0000 g | Freq: Once | INTRAVENOUS | Status: DC

## 2011-01-20 MED ORDER — PROMETHAZINE HCL 25 MG/ML IJ SOLN: 6.2500 mg | INTRAMUSCULAR | Status: DC | PRN

## 2011-01-20 MED ORDER — METOCLOPRAMIDE HCL 5 MG PO TABS: 5.0000 mg | ORAL_TABLET | Freq: Three times a day (TID) | ORAL | Status: DC | PRN

## 2011-01-20 MED ORDER — LACTATED RINGERS IV SOLN
INTRAVENOUS | Status: DC
  Administered 2011-01-20 (×3): via INTRAVENOUS

## 2011-01-20 MED ORDER — EPHEDRINE SULFATE 50 MG/ML IJ SOLN
INTRAMUSCULAR | Status: DC | PRN
  Administered 2011-01-20 (×3): 10 mg via INTRAVENOUS
  Administered 2011-01-20: 5 mg via INTRAVENOUS

## 2011-01-20 MED ORDER — HYDROMORPHONE HCL PF 1 MG/ML IJ SOLN
0.2500 mg | INTRAMUSCULAR | Status: DC | PRN
  Administered 2011-01-20 – 2011-01-21 (×7): 0.5 mg via INTRAVENOUS

## 2011-01-20 MED ORDER — IBUPROFEN 600 MG PO TABS: 600.0000 mg | ORAL_TABLET | Freq: Four times a day (QID) | ORAL | Status: AC | PRN

## 2011-01-20 MED ORDER — METOCLOPRAMIDE HCL 5 MG/ML IJ SOLN: 5.0000 mg | Freq: Three times a day (TID) | INTRAMUSCULAR | Status: DC | PRN

## 2011-01-20 MED ORDER — CEPHALEXIN 500 MG PO CAPS: 500.0000 mg | ORAL_CAPSULE | Freq: Three times a day (TID) | ORAL | Status: AC

## 2011-01-20 MED ORDER — GLYCOPYRROLATE 0.2 MG/ML IJ SOLN
INTRAMUSCULAR | Status: DC | PRN
  Administered 2011-01-20 (×2): 0.1 mg via INTRAVENOUS

## 2011-01-20 MED ORDER — SUCCINYLCHOLINE CHLORIDE 20 MG/ML IJ SOLN
INTRAMUSCULAR | Status: DC | PRN
  Administered 2011-01-20: 100 mg via INTRAVENOUS

## 2011-01-20 MED ORDER — PHENOL 1.4 % MT LIQD: 1.0000 | OROMUCOSAL | Status: DC | PRN

## 2011-01-20 MED ORDER — PROPOFOL 10 MG/ML IV EMUL
INTRAVENOUS | Status: DC | PRN
  Administered 2011-01-20 (×2): 60 mg via INTRAVENOUS
  Administered 2011-01-20: 120 mg via INTRAVENOUS

## 2011-01-20 MED ORDER — ONDANSETRON HCL 4 MG/2ML IJ SOLN: 4.0000 mg | Freq: Four times a day (QID) | INTRAMUSCULAR | Status: DC | PRN

## 2011-01-20 MED ORDER — MENTHOL 3 MG MT LOZG: 1.0000 | LOZENGE | OROMUCOSAL | Status: DC | PRN

## 2011-01-20 MED ORDER — MIDAZOLAM HCL 2 MG/2ML IJ SOLN
1.0000 mg | INTRAMUSCULAR | Status: DC | PRN
  Administered 2011-01-20: 1 mg via INTRAVENOUS

## 2011-01-20 MED ORDER — ACETAMINOPHEN 325 MG PO TABS
650.0000 mg | ORAL_TABLET | Freq: Four times a day (QID) | ORAL | Status: DC | PRN
  Administered 2011-01-21: 650 mg via ORAL

## 2011-01-20 MED ORDER — CEFAZOLIN SODIUM-DEXTROSE 2-3 GM-% IV SOLR
2.0000 g | Freq: Four times a day (QID) | INTRAVENOUS | Status: DC
  Administered 2011-01-20 – 2011-01-21 (×2): 2 g via INTRAVENOUS

## 2011-01-20 MED ORDER — FENTANYL CITRATE 0.05 MG/ML IJ SOLN
INTRAMUSCULAR | Status: DC | PRN
  Administered 2011-01-20: 100 ug via INTRAVENOUS

## 2011-01-20 MED ORDER — LIDOCAINE HCL (CARDIAC) 20 MG/ML IV SOLN
INTRAVENOUS | Status: DC | PRN
  Administered 2011-01-20: 40 mg via INTRAVENOUS

## 2011-01-20 MED ORDER — MIDAZOLAM HCL 5 MG/5ML IJ SOLN: INTRAMUSCULAR | Status: DC | PRN

## 2011-01-20 SURGICAL SUPPLY — 89 items
ANCHOR BIO SWLOCK 4.75 W/TIG (Anchor) ×2 IMPLANT
ANCHOR SUT BIO SW 4.75X19.1 (Anchor) ×2 IMPLANT
BANDAGE ADHESIVE 1X3 (GAUZE/BANDAGES/DRESSINGS) IMPLANT
BLADE AVERAGE 25X9 (BLADE) IMPLANT
BLADE CUTTER MENIS 5.5 (BLADE) ×2 IMPLANT
BLADE SURG 15 STRL LF DISP TIS (BLADE) ×2 IMPLANT
BLADE SURG 15 STRL SS (BLADE) ×2
BLADE VORTEX 6.0 (BLADE) IMPLANT
BUR EGG/OVAL CARBIDE (BURR) IMPLANT
BUR OVAL 6.0 (BURR) ×2 IMPLANT
CANISTER OMNI JUG 16 LITER (MISCELLANEOUS) ×2 IMPLANT
CANISTER SUCTION 1200CC (MISCELLANEOUS) ×2 IMPLANT
CANISTER SUCTION 2500CC (MISCELLANEOUS) ×6 IMPLANT
CANNULA 5.75X7 CRYSTAL CLEAR (CANNULA) IMPLANT
CANNULA SHOULDER 7CM (CANNULA) IMPLANT
CANNULA TWIST IN 8.25X7CM (CANNULA) ×2 IMPLANT
CLEANER CAUTERY TIP 5X5 PAD (MISCELLANEOUS) IMPLANT
CLOSURE STERI STRIP 1/2 X4 (GAUZE/BANDAGES/DRESSINGS) ×2 IMPLANT
CLOTH BEACON ORANGE TIMEOUT ST (SAFETY) ×2 IMPLANT
CUTTER MENISCUS  4.2MM (BLADE) ×1
CUTTER MENISCUS 4.2MM (BLADE) ×1 IMPLANT
DECANTER SPIKE VIAL GLASS SM (MISCELLANEOUS) IMPLANT
DRAPE INCISE IOBAN 66X45 STRL (DRAPES) ×2 IMPLANT
DRAPE STERI 35X30 U-POUCH (DRAPES) ×2 IMPLANT
DRAPE SURG 17X23 STRL (DRAPES) ×2 IMPLANT
DRAPE U-SHAPE 47X51 STRL (DRAPES) ×2 IMPLANT
DRAPE U-SHAPE 76X120 STRL (DRAPES) ×4 IMPLANT
DRSG PAD ABDOMINAL 8X10 ST (GAUZE/BANDAGES/DRESSINGS) ×2 IMPLANT
DRSG XEROFORM 1X8 (GAUZE/BANDAGES/DRESSINGS) ×2 IMPLANT
DURAPREP 26ML APPLICATOR (WOUND CARE) ×2 IMPLANT
ELECT REM PT RETURN 9FT ADLT (ELECTROSURGICAL) ×2
ELECTRODE REM PT RTRN 9FT ADLT (ELECTROSURGICAL) ×1 IMPLANT
GLOVE BIO SURGEON STRL SZ 6.5 (GLOVE) ×2 IMPLANT
GLOVE BIOGEL M STRL SZ7.5 (GLOVE) ×2 IMPLANT
GLOVE BIOGEL PI IND STRL 7.0 (GLOVE) ×1 IMPLANT
GLOVE BIOGEL PI IND STRL 8 (GLOVE) ×2 IMPLANT
GLOVE BIOGEL PI INDICATOR 7.0 (GLOVE) ×1
GLOVE BIOGEL PI INDICATOR 8 (GLOVE) ×2
GLOVE ORTHO TXT STRL SZ7.5 (GLOVE) ×2 IMPLANT
GOWN PREVENTION PLUS XLARGE (GOWN DISPOSABLE) ×2 IMPLANT
GOWN PREVENTION PLUS XXLARGE (GOWN DISPOSABLE) ×4 IMPLANT
NDL SUT 6 .5 CRC .975X.05 MAYO (NEEDLE) IMPLANT
NEEDLE MAYO TAPER (NEEDLE)
NEEDLE MINI RC 24MM (NEEDLE) IMPLANT
NEEDLE SCORPION (NEEDLE) ×2 IMPLANT
NEEDLE SCORPION MULTI FIRE (NEEDLE) ×2 IMPLANT
PACK ARTHROSCOPY DSU (CUSTOM PROCEDURE TRAY) ×2 IMPLANT
PACK BASIN DAY SURGERY FS (CUSTOM PROCEDURE TRAY) ×2 IMPLANT
PAD CLEANER CAUTERY TIP 5X5 (MISCELLANEOUS)
PASSER SUT SWANSON 36MM LOOP (INSTRUMENTS) IMPLANT
PENCIL BUTTON HOLSTER BLD 10FT (ELECTRODE) IMPLANT
RESECTOR FULL RADIUS 4.2MM (BLADE) IMPLANT
RESECTOR FULL RADIUS 4.8MM (BLADE) IMPLANT
SLEEVE SCD COMPRESS KNEE MED (MISCELLANEOUS) ×2 IMPLANT
SLING ARM FOAM STRAP LRG (SOFTGOODS) IMPLANT
SLING ARM FOAM STRAP MED (SOFTGOODS) IMPLANT
SLING ARM LGE (CAST SUPPLIES) ×2 IMPLANT
SPONGE GAUZE 4X4 12PLY (GAUZE/BANDAGES/DRESSINGS) ×2 IMPLANT
SPONGE LAP 4X18 X RAY DECT (DISPOSABLE) IMPLANT
STRIP CLOSURE SKIN 1/2X4 (GAUZE/BANDAGES/DRESSINGS) IMPLANT
SUCTION FRAZIER TIP 10 FR DISP (SUCTIONS) IMPLANT
SUT ETHIBOND 2 OS 4 DA (SUTURE) IMPLANT
SUT ETHILON 4 0 PS 2 18 (SUTURE) IMPLANT
SUT FIBERWIRE #2 38 T-5 BLUE (SUTURE)
SUT FIBERWIRE 3-0 18 TAPR NDL (SUTURE)
SUT LASSO 45 DEGREE (SUTURE) ×2 IMPLANT
SUT PROLENE 1 CT (SUTURE) IMPLANT
SUT PROLENE 3 0 PS 2 (SUTURE) ×4 IMPLANT
SUT TIGER TAPE 7 IN WHITE (SUTURE) IMPLANT
SUT VIC AB 0 CT1 27 (SUTURE)
SUT VIC AB 0 CT1 27XBRD ANBCTR (SUTURE) IMPLANT
SUT VIC AB 0 SH 27 (SUTURE) IMPLANT
SUT VIC AB 2-0 SH 27 (SUTURE)
SUT VIC AB 2-0 SH 27XBRD (SUTURE) IMPLANT
SUT VIC AB 3-0 SH 27 (SUTURE)
SUT VIC AB 3-0 SH 27X BRD (SUTURE) IMPLANT
SUT VIC AB 3-0 X1 27 (SUTURE) IMPLANT
SUTURE FIBERWR #2 38 T-5 BLUE (SUTURE) IMPLANT
SUTURE FIBERWR 3-0 18 TAPR NDL (SUTURE) IMPLANT
SYR 3ML 23GX1 SAFETY (SYRINGE) IMPLANT
SYR BULB 3OZ (MISCELLANEOUS) IMPLANT
TAPE FIBER 2MM 7IN #2 BLUE (SUTURE) IMPLANT
TAPE PAPER 3X10 WHT MICROPORE (GAUZE/BANDAGES/DRESSINGS) ×2 IMPLANT
TOWEL OR 17X24 6PK STRL BLUE (TOWEL DISPOSABLE) ×2 IMPLANT
TUBE CONNECTING 20X1/4 (TUBING) ×4 IMPLANT
TUBING ARTHROSCOPY IRRIG 16FT (MISCELLANEOUS) IMPLANT
WAND STAR VAC 90 (SURGICAL WAND) ×2 IMPLANT
WATER STERILE IRR 1000ML POUR (IV SOLUTION) ×2 IMPLANT
YANKAUER SUCT BULB TIP NO VENT (SUCTIONS) IMPLANT

## 2011-01-20 NOTE — H&P (Addendum)
MEDICARE   PATIENT:   Veronica Conner  SEEN BY:    Katy Fitch. Naaman Plummer. MD   OFFICE VISIT:   12-10-10 DOB: 10/02/1960  Sarine is a well known former patient who presents for evaluation of an increasing pain in her right shoulder. Tessia is s/p right shoulder arthroscopic subacromial decompression and distal clavicle resection in 2006. She had done well with respect to her shoulder symptoms for the past 6 years. During the past several months she has had night pain, pain with scaption, flexion, and external rotation. She cannot reach easily behind her back. She does have diabetes.   On exam she has mild restriction in ROM with combined elevation 170 right and 175 left, external rotation 80 right and 90 left, internal rotation to L3 right, T12 left. She has a painful push off test but has good strength.   Isolated testing of the infraspinatus, supraspinatus is very painful. There is crepitation with rapid ROM of her shoulder in elevation and cross torso adduction.   Plain films demonstrate her surgery for decompression and distal clavicle resection. She has a small calcification in the subacromial space.  Assessment: Probable rotator cuff syndrome and mild adhesive capsulitis associated with diabetes.  Plan: She is referred for an MRI of her shoulder due to her weakness and night symptoms. I will see her back to interpret the images next week. Questions were invited and answered in detail.     _________________________ Katy Fitch. Naaman Plummer., MD/pe T: 12-12-10                  MEDICARE   RE: Veronica Conner     Seen by: Katy Fitch. Naaman Plummer., MD   OFFICE VISIT    12-29-10   DOB 07/07/1960  Ms. Carreira returns for follow up evaluation of her right shoulder to discuss her right shoulder MRI. Wendolyn had increasing pain compatible with a rotator cuff tear. She is s/p right shoulder arthroscopic subacromial decompression, distal clavicle resection performed in 2006.   We  sent her for an MRI looking for signs of rotator cuff tear. Indeed she has a rotator cuff tear with AC arthrosis and narrowing subacromial outlet.   I have advised Zykirah to proceed with repair of her rotator cuff. The surgery, after care, risks and benefits were described in detail.  We will accomplish this on an outpatient basis with a 23 hour observation. Questions regarding the surgery were invited and answered in detail.      _________________________ Katy Fitch. Naaman Plummer., MD/pe T: 01-01-11  H&P documentation: 01/25/2011  -History and Physical Reviewed  -Patient has been re-examined  -No change in the plan of care  Wyn Forster, MD

## 2011-01-20 NOTE — Anesthesia Postprocedure Evaluation (Signed)
  Anesthesia Post-op Note  Patient: Veronica Conner  Procedure(s) Performed:  SHOULDER ARTHROSCOPY WITH ROTATOR CUFF REPAIR AND SUBACROMIAL DECOMPRESSION - right shoulder with SAD,DCR, RC REPAIR, Debridement  Patient Location: PACU  Anesthesia Type: GA combined with regional for post-op pain  Level of Consciousness: awake, alert  and oriented  Airway and Oxygen Therapy: Patient Spontanous Breathing and Patient connected to nasal cannula oxygen  Post-op Pain: mild  Post-op Assessment: Post-op Vital signs reviewed, Patient's Cardiovascular Status Stable, Respiratory Function Stable, Patent Airway, No signs of Nausea or vomiting and Pain level controlled  Post-op Vital Signs: Reviewed and stable  Complications: No apparent anesthesia complications

## 2011-01-20 NOTE — Anesthesia Preprocedure Evaluation (Addendum)
Anesthesia Evaluation  Patient identified by MRN, date of birth, ID band Patient awake    Reviewed: Allergy & Precautions, H&P , NPO status , Patient's Chart, lab work & pertinent test results  Airway Mallampati: II TM Distance: >3 FB Neck ROM: Full    Dental  (+) Partial Upper and Dental Advisory Given   Pulmonary asthma (last inhaler a few months ago) ,  clear to auscultation  Pulmonary exam normal       Cardiovascular hypertension (took BP med last night), Pt. on medications Regular Normal    Neuro/Psych    GI/Hepatic Neg liver ROS, GERD-  Medicated and Controlled,  Endo/Other  Diabetes mellitus- (glu 90 this am), Type 2, Oral Hypoglycemic AgentsMorbid obesity  Renal/GU negative Renal ROS     Musculoskeletal   Abdominal (+) obese,   Peds  Hematology   Anesthesia Other Findings   Reproductive/Obstetrics                          Anesthesia Physical Anesthesia Plan  ASA: III  Anesthesia Plan: General   Post-op Pain Management:    Induction: Intravenous  Airway Management Planned: Oral ETT  Additional Equipment:   Intra-op Plan:   Post-operative Plan: Extubation in OR  Informed Consent: I have reviewed the patients History and Physical, chart, labs and discussed the procedure including the risks, benefits and alternatives for the proposed anesthesia with the patient or authorized representative who has indicated his/her understanding and acceptance.   Dental advisory given  Plan Discussed with: CRNA and Surgeon  Anesthesia Plan Comments: (Plan routine monitors, GETA with interscalene block for post op analgesia)        Anesthesia Quick Evaluation

## 2011-01-20 NOTE — Anesthesia Procedure Notes (Addendum)
Anesthesia Regional Block:  Interscalene brachial plexus block  Pre-Anesthetic Checklist: ,, timeout performed, Correct Patient, Correct Site, Correct Laterality, Correct Procedure, Correct Position, site marked, Risks and benefits discussed,  Surgical consent,  Pre-op evaluation,  At surgeon's request and post-op pain management  Laterality: Right  Prep: chloraprep       Needles:  Injection technique: Single-shot  Needle Type: Stimulator Needle - 40      Needle Gauge: 22 and 22 G    Additional Needles:  Procedures: nerve stimulator Interscalene brachial plexus block  Nerve Stimulator or Paresthesia:  Response: forearm twitch, 0.44 mA, 1 ms,   Additional Responses:   Narrative:  Start time: 01/20/2011 1:08 PM End time: 01/20/2011 1:13 PM Injection made incrementally with aspirations every 5 mL.  Events: injection painful  Performed by: Personally  Anesthesiologist: Sandford Craze, MD  Additional Notes: Pt identified in Holding room.  Monitors applied. Working IV access confirmed. Sterile prep.  #22ga PNS to forearm twitch at 0.13mA threshold.  30cc 0.5% Bupivacaine with 1:200k epi injected incrementally after negative test dose.  Patient asymptomatic, VSS, no heme aspirated, tolerated well.   Sandford Craze, MD  Interscalene brachial plexus block Procedure Name: Intubation Date/Time: 01/20/2011 2:46 PM Performed by: Radford Pax Pre-anesthesia Checklist: Patient identified, Emergency Drugs available, Suction available, Patient being monitored and Timeout performed Patient Re-evaluated:Patient Re-evaluated prior to inductionOxygen Delivery Method: Circle System Utilized Preoxygenation: Pre-oxygenation with 100% oxygen Intubation Type: IV induction Ventilation: Mask ventilation without difficulty Laryngoscope Size: Miller and 2 Grade View: Grade I Tube type: Oral Number of attempts: 1 (easy intubation) Airway Equipment and Method: stylet and oral airway Placement  Confirmation: ETT inserted through vocal cords under direct vision,  positive ETCO2 and breath sounds checked- equal and bilateral Secured at: 22 (at teeth) cm Tube secured with: Tape (pink tape) Dental Injury: Teeth and Oropharynx as per pre-operative assessment

## 2011-01-20 NOTE — H&P (Signed)
Veronica Conner is an 50 y.o. female.   Chief Complaint: Complaining of progressive right shoulder pain over the past one year. HPI: This is a 50 year old right-hand-dominant female who presented to our office in early November of 2012 complaining of increasing pain and right shoulder. She is status post right shoulder arthroscopic subacromial decompression and distal clavicle resection performed in 2006. She has done well for the past 6 years. During the past several months she has noted increasing night pain, pain with scapular and, flexion and external rotation. She was sent for an MRI with revealed a right shoulder rotator cuff tear with a.c. arthrosis and a narrow subacromial outlet. She wishes to proceed with surgical intervention.  Past Medical History  Diagnosis Date  . Hypertension   . Diabetes mellitus   . Arthritis   . Anxiety   . Depression   . Shortness of breath   . GERD (gastroesophageal reflux disease)   . Hyperlipemia     Past Surgical History  Procedure Date  . Knee arthroscopy   . Replacement total knee   . Shoulder arthroscopy 2005    rt and lt  . Carpal tunnel release     both hands  . Bunionectomy     both feet  . Abdominal hysterectomy   . Appendectomy   . Joint replacement     History reviewed. No pertinent family history. Social History:  reports that she has never smoked. She does not have any smokeless tobacco history on file. She reports that she does not drink alcohol or use illicit drugs.  Allergies: No Known Allergies  Medications Prior to Admission  Medication Dose Route Frequency Provider Last Rate Last Dose  . ceFAZolin (ANCEF) IVPB 2 g/50 mL premix  2 g Intravenous Once Wyn Forster., MD      . chlorhexidine (HIBICLENS) 4 % liquid 4 application  60 mL Topical Once       . lactated ringers infusion   Intravenous Continuous Aubery Lapping, MD 20 mL/hr at 01/20/11 1115    . DISCONTD: ceFAZolin (ANCEF) IVPB 1 g/50 mL premix  1 g  Intravenous Once        Medications Prior to Admission  Medication Sig Dispense Refill  . atorvastatin (LIPITOR) 10 MG tablet Take 10 mg by mouth daily.        Marland Kitchen esomeprazole (NEXIUM) 40 MG capsule Take 40 mg by mouth daily before breakfast.        . estradiol (VIVELLE-DOT) 0.025 MG/24HR Place 1 patch onto the skin 2 (two) times a week.        . gabapentin (NEURONTIN) 100 MG capsule Take 100 mg by mouth 2 (two) times daily.        Marland Kitchen HYDROcodone-acetaminophen (NORCO) 5-325 MG per tablet Take 1 tablet by mouth every 6 (six) hours as needed.        Marland Kitchen lisinopril-hydrochlorothiazide (PRINZIDE,ZESTORETIC) 20-25 MG per tablet Take 1 tablet by mouth daily.        . metFORMIN (GLUCOPHAGE) 500 MG tablet Take 500 mg by mouth 2 (two) times daily with a meal.        . potassium chloride SA (K-DUR,KLOR-CON) 20 MEQ tablet Take 20 mEq by mouth 2 (two) times daily.        . sertraline (ZOLOFT) 50 MG tablet Take 50 mg by mouth daily. Takes 3 at same time       . albuterol (PROVENTIL HFA;VENTOLIN HFA) 108 (90 BASE) MCG/ACT inhaler Inhale 2 puffs into  the lungs every 6 (six) hours as needed.          Results for orders placed during the hospital encounter of 01/20/11 (from the past 48 hour(s))  BASIC METABOLIC PANEL     Status: Abnormal   Collection Time   01/19/11 11:00 AM      Component Value Range Comment   Sodium 138  135 - 145 (mEq/L)    Potassium 4.0  3.5 - 5.1 (mEq/L)    Chloride 100  96 - 112 (mEq/L)    CO2 28  19 - 32 (mEq/L)    Glucose, Bld 140 (*) 70 - 99 (mg/dL)    BUN 12  6 - 23 (mg/dL)    Creatinine, Ser 1.61  0.50 - 1.10 (mg/dL)    Calcium 9.8  8.4 - 10.5 (mg/dL)    GFR calc non Af Amer 77 (*) >90 (mL/min)    GFR calc Af Amer 90 (*) >90 (mL/min)   GLUCOSE, CAPILLARY     Status: Normal   Collection Time   01/20/11 11:19 AM      Component Value Range Comment   Glucose-Capillary 90  70 - 99 (mg/dL)   POCT HEMOGLOBIN-HEMACUE     Status: Normal   Collection Time   01/20/11 11:25 AM       Component Value Range Comment   Hemoglobin 13.5  12.0 - 15.0 (g/dL)     Dg Chest 2 View  09/60/4540  *RADIOLOGY REPORT*  Clinical Data: Diabetic with bronchitis.  CHEST - 2 VIEW  Comparison: 11/06/2008  Findings: Midline trachea.  Normal heart size and mediastinal contours. No pleural effusion or pneumothorax.  Clear lungs.  IMPRESSION: No acute cardiopulmonary disease.  Original Report Authenticated By: Consuello Bossier, M.D.     Pertinent items are noted in HPI.  Blood pressure 159/92, pulse 82, temperature 98.1 F (36.7 C), temperature source Oral, resp. rate 22, height 5\' 2"  (1.575 m), weight 102.059 kg (225 lb), SpO2 99.00%.  General appearance: alert Head: Normocephalic, without obvious abnormality Neck: supple, symmetrical, trachea midline Resp: clear to auscultation bilaterally Cardio: regular rate and rhythm, S1, S2 normal, no murmur, click, rub or gallop GI: normal findings: bowel sounds normal Extremities: Examination of the right shoulder reveals that she has mild restriction in range of motion with combined elevation of the 170 on the right versus 175 on the left. She has 80 of external rotation and internal rotation to L3 on the right. She has a painful pushoff test but has good strength. Isolated testing of the infraspinatus and supraspinatus were painful. She has crepitation with rapid weight of motion of her shoulder in elevation and cross body adduction. X-rays revealed a small calcification in the subacromial space. Pulses: 2+ and symmetric Skin: normal Neurologic: Grossly normal    Assessment/Plan Impression right shoulder rotator cuff tear with A. C. Arthrosis and narrowed subacromial outlet.  Patient to be taken to the operating room to undergo right shoulder arthroscopy with subacromial decompression, distal clavicle resection and rotator cuff repair as needed. The procedure risks benefits and postoperative course were again discussed with the patient and she was  in agreement with this plan.  DASNOIT,Brent Noto J 01/20/2011, 12:41 PM    H&P documentation: 01/20/2011  -History and Physical Reviewed  -Patient has been re-examined  -No change in the plan of care  Wyn Forster, MD

## 2011-01-20 NOTE — Progress Notes (Signed)
Assisted Dr. Jackson with right, interscalene  block. Side rails up, monitors on throughout procedure. See vital signs in flow sheet. Tolerated Procedure well. 

## 2011-01-20 NOTE — Op Note (Signed)
OP NOTE DICTATED:  01/20/11 [id number]

## 2011-01-20 NOTE — Brief Op Note (Signed)
01/20/2011  4:53 PM  PATIENT:  Edyth Gunnels  50 y.o. female  PRE-OPERATIVE DIAGNOSIS:  rotator cuff tear on right, a-c arthritis, stage 3 impingement  POST-OPERATIVE DIAGNOSIS:  right rotator cuff tear, a-c arthritis, stage 3 impingement  PROCEDURE:  Procedure(s): SHOULDER ARTHROSCOPY WITH ROTATOR CUFF REPAIR AND SUBACROMIAL DECOMPRESSION, SCOPE DISTAL CLAVICLE EXCISION  SURGEON:  Surgeon(s): Wyn Forster., MD  PHYSICIAN ASSISTANT:   ASSISTANTS: Mallory Shirk.A-C   ANESTHESIA:   general  EBL:  Total I/O In: 1100 [I.V.:1100] Out: -   BLOOD ADMINISTERED:none  DRAINS: none   LOCAL MEDICATIONS USED:  NONE  SPECIMEN:  No Specimen  DISPOSITION OF SPECIMEN:  N/A  COUNTS:  YES  TOURNIQUET:  * No tourniquets in log *  DICTATION: .Other Dictation: Dictation Number [id number]  PLAN OF CARE: Admit for overnight observation  PATIENT DISPOSITION:  PACU - hemodynamically stable.

## 2011-01-20 NOTE — Interval H&P Note (Signed)
History and Physical Interval Note:  01/20/2011 2:02 PM  Veronica Conner  has presented today for surgery, with the diagnosis of rotator cuff tear on right  The various methods of treatment have been discussed with the patient and family. After consideration of risks, benefits and other options for treatment, the patient has consented to  Procedure(s): SHOULDER ARTHROSCOPY WITH ROTATOR CUFF REPAIR AND SUBACROMIAL DECOMPRESSION as a surgical intervention .  The patients' history has been reviewed, patient examined, no change in status, stable for surgery.  I have reviewed the patients' chart and labs.  Questions were answered to the patient's satisfaction.     Milley Vining JR,Garald Rhew V  H&P documentation: 01/20/2011  -History and Physical Reviewed  -Patient has been re-examined  -No change in the plan of care  Wyn Forster, MD

## 2011-01-20 NOTE — Transfer of Care (Signed)
Immediate Anesthesia Transfer of Care Note  Patient: Veronica Conner  Procedure(s) Performed:  SHOULDER ARTHROSCOPY WITH ROTATOR CUFF REPAIR AND SUBACROMIAL DECOMPRESSION - right shoulder with SAD,DCR, RC REPAIR, Debridement  Patient Location: PACU  Anesthesia Type: GA combined with regional for post-op pain  Level of Consciousness: sedated  Airway & Oxygen Therapy: Patient Spontanous Breathing and Patient connected to face mask oxygen  Post-op Assessment: Report given to PACU RN and Post -op Vital signs reviewed and stable  Post vital signs: Reviewed and stable  Complications: No apparent anesthesia complications

## 2011-01-20 NOTE — H&P (View-Only) (Signed)
Coming in for cxr,ekg,lab-to bring all meds-overnight bag Denies osa 

## 2011-01-21 LAB — GLUCOSE, CAPILLARY: Glucose-Capillary: 96 mg/dL (ref 70–99)

## 2011-01-21 MED ORDER — IBUPROFEN 600 MG PO TABS
600.0000 mg | ORAL_TABLET | Freq: Four times a day (QID) | ORAL | Status: DC | PRN
  Administered 2011-01-21: 600 mg via ORAL

## 2011-01-21 NOTE — Op Note (Signed)
NAMEKENZLEIGH, SEDAM            ACCOUNT NO.:  [id number]  MEDICAL RECORD NO.:  [id number]  LOCATION:                                 FACILITY:  PHYSICIAN:  Katy Fitch. Olivia Pavelko, M.D.      DATE OF BIRTH:  DATE OF PROCEDURE:  01/20/2011 DATE OF DISCHARGE:                              OPERATIVE REPORT   PREOPERATIVE DIAGNOSES:  MRI documented full-thickness rotator cuff tear, anterior supraspinatus and acromioclavicular degenerative arthritis with an unfavorable anterior acromial anatomy.  POSTOPERATIVE DIAGNOSES:  PASTA/PITA very irregular degenerative midsubstance rotator cuff tear, full thickness of the anterior supraspinatus, acromioclavicular degenerative arthritis, and unfavorable acromial anatomy.  OPERATION: 1. Diagnostic arthroscopy, right glenohumeral joint. 2. Arthroscopic debridement of PASTA/PITA tear with decortication of     greater tuberosity. 3. Arthroscopic reconstruction of PASTA/PITA mid substance rotator     cuff tear to decorticate greater tuberosity with finishing suture     utilizing a medial swivel lock and a lateral swivel lock with speed     bridge technique and a finishing suture with the lateral swivel     lock,  retrieval suture. 4. Arthroscopic distal clavicle resection. 5. Arthroscopic subacromial decompression with acromioplasty,     bursectomy, coracoacromial ligament release.  SURGEON:  Katy Fitch. Zarahi Fuerst, M.D.  ASSIST:  Marveen Reeks Dasnoit, P.A.  ANESTHESIA:  General by endotracheal technique.  SUPERVISING ANESTHESIOLOGIST:  Germaine Pomfret, MD  INDICATIONS:  Casara Perrier is a well known 50 year old woman who has been an active patient with our practice for more than decade.  She is status post a left rotator cuff reconstruction in 2007 and a prior right shoulder arthroscopic subacromial decompression, and debridement in 2005.  She returned with increasing pain in the right shoulder.  Ms. Pickert has multiple background medical  problems which include hypertension, diabetes and other medical predicaments including obesity and a degree of osteoarthrosis.  We advised her to undergo MRI imaging of her right shoulder.  This was accomplished and revealed a full-thickness tear of the anterior supraspinatus and significant tendinopathy of the mid substance of the supraspinatus and anterior infraspinatus.  We advised her to proceed with arthroscopic evaluation of her shoulder, subacromial decompression, distal clavicle resection, and reconstruction of rotator cuff.  Given her body habitus, her background medical problems at all, we elected to repair this arthroscopically.  Preoperatively, she was reminded of the potential risks and benefits of surgery.  Her diabetes may increase her risk for infection and may slow her healing.  She has a greater risk for development adhesive capsulitis postoperatively with background diabetes.  After informed consent, she is brought to the operating room at this time.  PROCEDURE:  Lusia Greis was brought to room 6 of [facility] and placed supine position on the operating table.  Under Dr. Edison Pace direct supervision, general anesthesia by endotracheal technique was induced followed by careful position in the beach-chair position with aid of a torso and head holder designed for shoulder arthroscopy.  The entire right upper extremity was prepped with DuraPrep and draped with impervious arthroscopy drapes.  2 g of Ancef was administered per weight protocol.  Procedure commenced with a routine surgical time-out.  The scope was  placed through a standard posterior viewing portal with anterior switching stick technique.  Diagnostic arthroscopy revealed a veil of scar tissue at the site of her anterior supraspinatus where the MRI documented a full-thickness tear.  Anterior port was created under direct vision followed by use of a suction shaver to remove the veil  scar and immediately we found a very large intrasubstance pocket consistent with PITA tear with marked degeneration of the infraspinatus anterior portion and the entire supraspinatus.  This was debrided to the anterior portal and subsequently a portal was created at the anterior, superior, lateral position and a 4.2 mm suction shaver was used to debride the tendon to healthy tissue and to decorticate the greater tuberosity to bleeding bone.  After completion of the articular debridement, we inspected the labrum which was noted to be intact.  The long head of the biceps had a stable origin at the superior glenoid.  The inferior recess was unremarkable and hyaline articular cartilage of the humeral head was intact with the exception of on unusual bump just medial to the greater tuberosity. This did not appear to be the consequence of a prior cuff tear.  The scope was removed from glenohumeral joint and placed in subacromial space.  Florid bursitis was debrided followed by leveling the acromion to type 1 morphology.  The coracoacromial ligament released with cutting cautery and the capsule of AC joint was excised.  The distal clavicle was degenerative.  The distal centimeter clavicle was removed arthroscopically with hemostasis obtained with the bipolar cautery.  The subacromial space was thoroughly lavaged with sterile saline removing all bone scrap followed by hemostasis with bipolar cautery. The tear was relatively modest in the bursa; however, after proper debridement, we created an anterior superior lateral portal, a central portal, and a posterior portal which allowed [place] to instrument the tear to cannulas.  We placed a medial corkscrew at the central aspect of the supraspinatus and used a scorpion suture passer to place the 2 tails of the fiber tape in the anterior and posterior leaflets of the tear. Subsequently using the central retrieval suture of #2 FiberWire to create a more  lateral mattress creating margin convergence followed by lateralization and inset of the tendon to the decorticated tuberosity with a swivel lock gathering all 4 suture tails.  There was a small dog- ear anteriorly therefore I use the retrieval suture on the lateral swivel lock with the aid of a suture Lasso to create a mattress stitch insetting the dog ear.  A very satisfactory construct was achieved.  The documentary photograph was obtained showing a low-profile repair.  The scope was replaced in the glenohumeral joint and the inset of the tendon appeared to be very satisfactory and the long head of the biceps was directly inspected and found to be not impaired by any suture contact.  After thorough irrigation, a subacromial space, the arthroscopic clip was removed, and the portals repaired with intradermal 3-0 Prolene.  There no apparent complications.  For aftercare, Ms. Voyles will be admitted to [facility] for observation and vital signs and will be provided p.o. and IV Dilaudid for her pain.  She will be discharged in approximately 20 hours to the care of her husband with prescriptions for Keflex 500 mg 1 p.o. q.8 h. x3 days, 12 tablets without refill.  She is also provided Dilaudid 2 mg 1 or 2 tablets p.o. q.4 h. p.r.n. pain, 30 tablets without refill.  She is provided ibuprofen 600 mg 1  p.o. q.6 h. p.r.n. pain.  She will not take Mobic or other nonsteroidal medication while on the Motrin.     Katy Fitch Carron Jaggi, M.D.     RVS/MEDQ  D:  01/20/2011  T:  01/21/2011  Job:  [id number]

## 2011-01-21 NOTE — Discharge Instructions (Signed)
[facility] Instructions Hand Surgery  Wound Care: Keep your hand elevated above the level of your heart.  Do not allow it to dangle  by your side.  Keep the dressing dry and do not remove it unless your doctor advises you to do so.  He will usually change it at the time of your post-op visit.  Moving your fingers is advised to stimulate circulation but will depend on the site of your surgery.  If you have a splint applied, your doctor will advise you regarding movement.  Activity: Do not drive or operate machinery today.  Rest today and then you may return to your normal activity and work as indicated by your physician.  Diet:  Drink liquids today or eat a light diet.  You may resume a regular diet tomorrow.    General expectations: Pain for two to three days. Fingers may become slightly swollen.  Call your doctor if any of the following occur: Severe pain not relieved by pain medication. Elevated temperature. Dressing soaked with blood. Inability to move fingers. White or bluish color to fingers.  Shoulder Arthroscopy  Because the shoulder is one of the most mobile joints, it is more prone to injury. It is a very shallow ball and socket joint located between the large bone in your upper arm (humerus) and the shoulder blade (scapula). Arthroscopy is a valuable test for evaluating and treating injuries involving the shoulder joint. Arthroscopy is a surgical technique which uses small incisions (cuts by the surgeon) to insert a small telescope like instrument (arthroscope) and other tools into the shoulder. This allows the surgeon to look directly at the problem. When the arthroscope is in the joint, fluid is used to expand the joint space. This allows the surgeon to examine it more easily. The arthroscope then beams light into the joint and sends an image to a TV screen. As your surgeon examines your shoulder, he or she can also repair a number of problems found at the same time. Sometimes  the procedure may change to an open surgery. This would happen if the problems are severe enough that they cannot be corrected with just arthroscopy. This is usually a very safe surgery. Rare complications include damage to nerves or blood vessels, excess bleeding, blood clots, infection, and rarely instrument failure. This is most often performed as a same day surgery. This means you will not have to stay in the hospital overnight. Recovery from this surgery is also much faster than having an open procedure. LET YOUR CAREGIVER KNOW ABOUT:  Allergies.   Medications taken including herbs, eye drops, over the counter medications, and creams.   Use of steroids (by mouth or creams).   Previous problems with anesthetics or novocaine.   Possibility of pregnancy, if this applies.   History of blood clots (thrombophlebitis).   History of bleeding or blood problems.   Previous surgery.   Other health problems.   Family history of anesthetic problems.   AFTER YOUR PROCEDURE  After surgery you will be taken to the recovery area. A nurse will watch and check your progress. Once you are awake, stable, and taking fluids well, barring other problems you will be allowed to go home.   Once home, apply an ice pack to your operative site for twenty minutes, three to four times per day, for two to three days. This may help with discomfort and keep the swelling down.   Use a sling and medications if prescribed or as instructed.  Unless your caregiver advises otherwise, move your arm and shoulder gently and frequently following the procedure. This can help prevent stiffness and swelling.  REHABILITATION  Almost as important as your surgery is your rehabilitation. If physical therapy and exercises are prescribed by your surgeon, follow them diligently. Once comfortable and on your way to full use, do muscle strengthening exercises as instructed.   Only take over-the-counter or prescription medicines  for pain, discomfort, or fever as directed by your caregiver.  SEEK IMMEDIATE MEDICAL CARE IF:   There is redness, swelling, or increasing pain in the wound or joint.   You notice purulent (colored- pus-like) drainage coming from the wound.   An unexplained oral temperature above 102 F (38.9 C) develops.   You notice a foul smell coming from the wound or dressing.   There is a breaking open of the wound. The edges do not stay together after sutures or tape has been removed.   Persistent bleeding from the small incision.   Your caregiver has placed a nerve block in one of your arms or legs to reduce pain and discomfort. The block lessens the amount of pain medicine you will need. Your caregiver will inject you in the arm or leg that was operated on. The injection is usually given away from the surgical site. The injection is a local anesthetic or a combination of local anesthetics. This injection provides numbing pain relief for up to 18 to 24 hours. There are few possible complications from this procedure. However, you should notify your caregiver if you have any problems. Be aware that you may lose feeling at and around the surgical area. If numbness happens, take proper measures to avoid injury. Do not stand up unassisted if you have a nerve block in your leg. Do not try to lift items if you have had a nerve block in your arm. Be careful when placing hot or cold items on a numb extremity. SEEK IMMEDIATE MEDICAL CARE IF:  You have redness, swelling, pain, or discharge at the injection site.   You develop dizziness or lightheadedness.   You have blurred vision.   There is a ringing or buzzing in your ears.   You have a metal taste in your mouth.   You develop numbness or tingling around your mouth.   You develop drowsiness.   You develop confusion.

## 2015-02-05 ENCOUNTER — Telehealth (HOSPITAL_COMMUNITY): Payer: Self-pay | Admitting: Vascular Surgery

## 2015-02-05 NOTE — Progress Notes (Addendum)
Anesthesia Chart Review: Patient is a 55 year old female who is not currently scheduled for surgery, but I was contacted by Roanna RaiderSherri at Dr. Sherene SiresWainer's office to review her records as he is wanting to eventually post her for a left knee scope chondroplasty, medial meniscectomy. He wanted anesthesiologist opinion regarding timing of surgery scheduling/need for pre-operative anesthesia consult.   I have reviewed the most recent records in Epic and Care Everywhere.   History includes chronic systolic CHF (diagnosed 2013), HTN, DM2, HLD, mitral regurgitation, secondary pulmonary hypertension, OSA on CPAP, CKD (likely related to cardiorenal syndrome and microvascular disease), anemia, anxiety, depression, PNA 10/2014, hysterectomy, right TKA '10, appendectomy, L4-5 PLIF '14, obesity. [facility] [place] [phone]), last visit with Molli BarrowsMarie Dinges, NP.  - Nephrologist is Dr. Gwendlyn DeutscherMustafa Mawih with [facility], last visit 01/01/15. Renal function CKD stage III felt stable at that time with a baseline ~ 1.17-1.35 (high 1.5 10/25/14). He recommended: holding metformin and diuretics while NPO, close monitoring of fluid status and BP, and in-patient renal consult if needed.   - Cardiologist is Dr. Chinita PesterJoseph O'Neill with [facility] [place] ([facility] Failure[facility]). Last visit 01/10/15. His notation includes recommendations for another echo in a few months to make sure LVEF is continuing to improve or will need referral to EP. In regards to her surgery, her writes, "I have agreed she is ok to proceed with knee arthroscopy with local anesthesia (not knee replacement) but she is wanting to postpone this surgery anyway." She has also seen cardiologist Dr. Tobi BastosKatie Twomley for OSA.   09/20/14 TTE Echo (Care Everywhere): SUMMARY: Severely reduced LV systolic function with dilated LV and global hypokinesis, EF 25-30%. Mild MR. Left atrial enlargement (LA mildly dilated). Trace AR.  Mild TR. Trace PR. Compared to prior there has been increase in overall LVEF. (Previous echo on 04/17/14 showed LVEF 15-20%.)  08/23/14 CUS Metabolic Bike Exercise Study (Care Everywhere): The peak oxygen consumption 14.7 ml/kg/min (88% predicted), which is 28 ml/kg/min corrected for IBW. Test is consistent with no more than mild cardiac limitation to exercise. No indication of pulmonary limitation to exercise.  06/07/14 EKG (Care Everywhere): Result Narrative: Sinus rhythm. Borderline evidence for LVH with repolarization abnormalities. Poor progress of the R waves across the precordium. When compared with ECG of 30-Mar-2014 07:03, Questionable change in initial forces of lateral leads. T wave inversion less evident in lateral leads. Confirmed by [facility], DR. F.R. ([id number]) on 06/07/2014 2:47:54 PM.   06/07/14 RHC (Care Everywhere):  PRESSURES: Site Baseline Values, mmHg     S/P Angiography, mmHg  S/D       Mean       S/D      Mean RA        21  RV  71 / 21           RVEDP = 25  PA  73 / 41      51  PCW        38  AO  152 / 108      123  OXIMETRY:   Site      % O2 Sat   SVC      63   RA       57   RV       57   PA       60   PCW     92 OTHER HEMODYNAMIC INFORMATION:   Heart Rate:    -  312   Hemoglobin:    10.1 gm/dL   A-V O2 Difference: 45 vol %   O2 Consumption:  205 ml/min    Cardiac Output (L/min):     Fick:  4.56     Thermal: 5   Cardiac Index (L/min/M sq):     Fick:  2.31     Thermal: 2.54   Vascular Resistance (dyne sec cm-5):     Systemic:       1789.5     Total Pulmonary:   824.6     Pulmonary Arteriolar: 157.9   04/17/14 Nuclear stress test (Care Everywhere): No evidence of inducible ischemia. Global hypokinesis with an ejection fraction of 27%.  11/14/14 CXR (Care Everywhere): FINDINGS: Stable cardiac and mediastinal contours. Near-complete  resolution of consolidation in the left midlung. No new suspicious airspace opacities or consolidation. No pneumothorax or pleural effusion. No acute osseous findings. Partially imaged posterior lumbar fusion hardware.  I will have one of our anesthesiologists review available information for recommendations.  Velna Ochs [facility] Stay Center/Anesthesiology Phone [phone] 02/05/2015 5:30 PM  Addendum: Above discussed with anesthesiologist Dr. Hart Rochester. Recommend that surgery be postponed until patient has had her follow-up echo to re-evaluate EF. Based on those results, Dr. Bufford Buttner can determine if she needs EP referral or if she may proceed with surgery. Previously he only cleared her for "local anesthesia", so this may need to be clarified as well. I have left a voice message with Sherri regarding above.  Velna Ochs [facility] Short Stay Center/Anesthesiology Phone [phone] 02/06/2015 1:51 PM

## 2018-08-05 NOTE — Progress Notes (Signed)
COVID Hotel Screening performed. Temperature, PHQ-9, and need for medical care and medications assessed. No additional needs assessed at this time.  Schon Zeiders  MSN, RN 

## 2022-08-11 DIAGNOSIS — Z95810 Presence of automatic (implantable) cardiac defibrillator: Secondary | ICD-10-CM | POA: Diagnosis not present

## 2022-08-11 DIAGNOSIS — R0789 Other chest pain: Secondary | ICD-10-CM | POA: Diagnosis not present

## 2022-08-19 DIAGNOSIS — J988 Other specified respiratory disorders: Secondary | ICD-10-CM | POA: Diagnosis not present

## 2022-08-19 DIAGNOSIS — Z20822 Contact with and (suspected) exposure to covid-19: Secondary | ICD-10-CM | POA: Diagnosis not present

## 2022-08-23 DIAGNOSIS — E1122 Type 2 diabetes mellitus with diabetic chronic kidney disease: Secondary | ICD-10-CM | POA: Diagnosis not present

## 2022-08-23 DIAGNOSIS — I5022 Chronic systolic (congestive) heart failure: Secondary | ICD-10-CM | POA: Diagnosis not present

## 2022-08-23 DIAGNOSIS — Z95810 Presence of automatic (implantable) cardiac defibrillator: Secondary | ICD-10-CM | POA: Diagnosis not present

## 2022-08-23 DIAGNOSIS — R0602 Shortness of breath: Secondary | ICD-10-CM | POA: Diagnosis not present

## 2022-08-24 DIAGNOSIS — E1122 Type 2 diabetes mellitus with diabetic chronic kidney disease: Secondary | ICD-10-CM | POA: Diagnosis not present

## 2022-09-23 DIAGNOSIS — R0602 Shortness of breath: Secondary | ICD-10-CM | POA: Diagnosis not present

## 2022-09-23 DIAGNOSIS — Z95810 Presence of automatic (implantable) cardiac defibrillator: Secondary | ICD-10-CM | POA: Diagnosis not present

## 2022-09-23 DIAGNOSIS — E1122 Type 2 diabetes mellitus with diabetic chronic kidney disease: Secondary | ICD-10-CM | POA: Diagnosis not present

## 2022-09-23 DIAGNOSIS — I5022 Chronic systolic (congestive) heart failure: Secondary | ICD-10-CM | POA: Diagnosis not present

## 2022-09-24 DIAGNOSIS — Z9889 Other specified postprocedural states: Secondary | ICD-10-CM | POA: Diagnosis not present

## 2022-09-24 DIAGNOSIS — Z95810 Presence of automatic (implantable) cardiac defibrillator: Secondary | ICD-10-CM | POA: Diagnosis not present

## 2022-09-24 DIAGNOSIS — L7682 Other postprocedural complications of skin and subcutaneous tissue: Secondary | ICD-10-CM | POA: Diagnosis not present

## 2022-09-24 DIAGNOSIS — I499 Cardiac arrhythmia, unspecified: Secondary | ICD-10-CM | POA: Diagnosis not present

## 2022-09-24 DIAGNOSIS — L910 Hypertrophic scar: Secondary | ICD-10-CM | POA: Diagnosis not present

## 2022-09-28 DIAGNOSIS — I10 Essential (primary) hypertension: Secondary | ICD-10-CM | POA: Diagnosis not present

## 2022-09-28 DIAGNOSIS — N1832 Chronic kidney disease, stage 3b: Secondary | ICD-10-CM | POA: Diagnosis not present

## 2022-09-28 DIAGNOSIS — E1122 Type 2 diabetes mellitus with diabetic chronic kidney disease: Secondary | ICD-10-CM | POA: Diagnosis not present

## 2022-09-28 DIAGNOSIS — E7849 Other hyperlipidemia: Secondary | ICD-10-CM | POA: Diagnosis not present

## 2022-09-28 DIAGNOSIS — N183 Chronic kidney disease, stage 3 unspecified: Secondary | ICD-10-CM | POA: Diagnosis not present

## 2022-09-28 DIAGNOSIS — Z794 Long term (current) use of insulin: Secondary | ICD-10-CM | POA: Diagnosis not present

## 2022-10-01 DIAGNOSIS — I5022 Chronic systolic (congestive) heart failure: Secondary | ICD-10-CM | POA: Diagnosis not present

## 2022-10-01 DIAGNOSIS — Z4502 Encounter for adjustment and management of automatic implantable cardiac defibrillator: Secondary | ICD-10-CM | POA: Diagnosis not present

## 2022-10-02 DIAGNOSIS — Z4502 Encounter for adjustment and management of automatic implantable cardiac defibrillator: Secondary | ICD-10-CM | POA: Diagnosis not present

## 2022-10-02 DIAGNOSIS — I5022 Chronic systolic (congestive) heart failure: Secondary | ICD-10-CM | POA: Diagnosis not present

## 2022-10-08 DIAGNOSIS — H25013 Cortical age-related cataract, bilateral: Secondary | ICD-10-CM | POA: Diagnosis not present

## 2022-10-08 DIAGNOSIS — H25043 Posterior subcapsular polar age-related cataract, bilateral: Secondary | ICD-10-CM | POA: Diagnosis not present

## 2022-10-08 DIAGNOSIS — H2512 Age-related nuclear cataract, left eye: Secondary | ICD-10-CM | POA: Diagnosis not present

## 2022-10-08 DIAGNOSIS — H2513 Age-related nuclear cataract, bilateral: Secondary | ICD-10-CM | POA: Diagnosis not present

## 2022-10-08 DIAGNOSIS — H18413 Arcus senilis, bilateral: Secondary | ICD-10-CM | POA: Diagnosis not present

## 2022-10-15 DIAGNOSIS — Z95810 Presence of automatic (implantable) cardiac defibrillator: Secondary | ICD-10-CM | POA: Diagnosis not present

## 2022-10-15 DIAGNOSIS — Z9889 Other specified postprocedural states: Secondary | ICD-10-CM | POA: Diagnosis not present

## 2022-10-15 DIAGNOSIS — L910 Hypertrophic scar: Secondary | ICD-10-CM | POA: Diagnosis not present

## 2022-10-15 DIAGNOSIS — L7682 Other postprocedural complications of skin and subcutaneous tissue: Secondary | ICD-10-CM | POA: Diagnosis not present

## 2022-10-24 DIAGNOSIS — R0602 Shortness of breath: Secondary | ICD-10-CM | POA: Diagnosis not present

## 2022-10-24 DIAGNOSIS — E1122 Type 2 diabetes mellitus with diabetic chronic kidney disease: Secondary | ICD-10-CM | POA: Diagnosis not present

## 2022-10-24 DIAGNOSIS — Z95810 Presence of automatic (implantable) cardiac defibrillator: Secondary | ICD-10-CM | POA: Diagnosis not present

## 2022-10-24 DIAGNOSIS — I5022 Chronic systolic (congestive) heart failure: Secondary | ICD-10-CM | POA: Diagnosis not present

## 2022-11-23 DIAGNOSIS — E1122 Type 2 diabetes mellitus with diabetic chronic kidney disease: Secondary | ICD-10-CM | POA: Diagnosis not present

## 2022-11-23 DIAGNOSIS — I5022 Chronic systolic (congestive) heart failure: Secondary | ICD-10-CM | POA: Diagnosis not present

## 2022-11-23 DIAGNOSIS — Z95810 Presence of automatic (implantable) cardiac defibrillator: Secondary | ICD-10-CM | POA: Diagnosis not present

## 2022-11-23 DIAGNOSIS — R0602 Shortness of breath: Secondary | ICD-10-CM | POA: Diagnosis not present

## 2022-12-02 DIAGNOSIS — E1122 Type 2 diabetes mellitus with diabetic chronic kidney disease: Secondary | ICD-10-CM | POA: Diagnosis not present

## 2022-12-16 DIAGNOSIS — M1711 Unilateral primary osteoarthritis, right knee: Secondary | ICD-10-CM | POA: Diagnosis not present

## 2022-12-16 DIAGNOSIS — N183 Chronic kidney disease, stage 3 unspecified: Secondary | ICD-10-CM | POA: Diagnosis not present

## 2022-12-16 DIAGNOSIS — Z794 Long term (current) use of insulin: Secondary | ICD-10-CM | POA: Diagnosis not present

## 2022-12-16 DIAGNOSIS — E1122 Type 2 diabetes mellitus with diabetic chronic kidney disease: Secondary | ICD-10-CM | POA: Diagnosis not present

## 2022-12-16 DIAGNOSIS — I5022 Chronic systolic (congestive) heart failure: Secondary | ICD-10-CM | POA: Diagnosis not present

## 2022-12-16 DIAGNOSIS — M5416 Radiculopathy, lumbar region: Secondary | ICD-10-CM | POA: Diagnosis not present

## 2022-12-24 DIAGNOSIS — R0602 Shortness of breath: Secondary | ICD-10-CM | POA: Diagnosis not present

## 2022-12-24 DIAGNOSIS — Z95810 Presence of automatic (implantable) cardiac defibrillator: Secondary | ICD-10-CM | POA: Diagnosis not present

## 2022-12-24 DIAGNOSIS — E1122 Type 2 diabetes mellitus with diabetic chronic kidney disease: Secondary | ICD-10-CM | POA: Diagnosis not present

## 2022-12-24 DIAGNOSIS — I5022 Chronic systolic (congestive) heart failure: Secondary | ICD-10-CM | POA: Diagnosis not present

## 2022-12-25 DIAGNOSIS — E1122 Type 2 diabetes mellitus with diabetic chronic kidney disease: Secondary | ICD-10-CM | POA: Diagnosis not present

## 2022-12-25 DIAGNOSIS — N1832 Chronic kidney disease, stage 3b: Secondary | ICD-10-CM | POA: Diagnosis not present

## 2022-12-25 DIAGNOSIS — N183 Chronic kidney disease, stage 3 unspecified: Secondary | ICD-10-CM | POA: Diagnosis not present

## 2022-12-25 DIAGNOSIS — Z794 Long term (current) use of insulin: Secondary | ICD-10-CM | POA: Diagnosis not present

## 2022-12-29 DIAGNOSIS — N1832 Chronic kidney disease, stage 3b: Secondary | ICD-10-CM | POA: Diagnosis not present

## 2022-12-29 DIAGNOSIS — E790 Hyperuricemia without signs of inflammatory arthritis and tophaceous disease: Secondary | ICD-10-CM | POA: Diagnosis not present

## 2022-12-29 DIAGNOSIS — N2581 Secondary hyperparathyroidism of renal origin: Secondary | ICD-10-CM | POA: Diagnosis not present

## 2022-12-29 DIAGNOSIS — I129 Hypertensive chronic kidney disease with stage 1 through stage 4 chronic kidney disease, or unspecified chronic kidney disease: Secondary | ICD-10-CM | POA: Diagnosis not present

## 2023-01-04 DIAGNOSIS — I5022 Chronic systolic (congestive) heart failure: Secondary | ICD-10-CM | POA: Diagnosis not present

## 2023-01-05 DIAGNOSIS — I5022 Chronic systolic (congestive) heart failure: Secondary | ICD-10-CM | POA: Diagnosis not present

## 2023-01-05 DIAGNOSIS — Z4502 Encounter for adjustment and management of automatic implantable cardiac defibrillator: Secondary | ICD-10-CM | POA: Diagnosis not present

## 2023-01-13 DIAGNOSIS — Z1231 Encounter for screening mammogram for malignant neoplasm of breast: Secondary | ICD-10-CM | POA: Diagnosis not present

## 2023-01-20 DIAGNOSIS — Z0000 Encounter for general adult medical examination without abnormal findings: Secondary | ICD-10-CM | POA: Diagnosis not present

## 2023-01-20 DIAGNOSIS — M1712 Unilateral primary osteoarthritis, left knee: Secondary | ICD-10-CM | POA: Diagnosis not present

## 2023-01-23 DIAGNOSIS — I5022 Chronic systolic (congestive) heart failure: Secondary | ICD-10-CM | POA: Diagnosis not present

## 2023-01-23 DIAGNOSIS — Z95810 Presence of automatic (implantable) cardiac defibrillator: Secondary | ICD-10-CM | POA: Diagnosis not present

## 2023-01-23 DIAGNOSIS — R0602 Shortness of breath: Secondary | ICD-10-CM | POA: Diagnosis not present

## 2023-01-23 DIAGNOSIS — E1122 Type 2 diabetes mellitus with diabetic chronic kidney disease: Secondary | ICD-10-CM | POA: Diagnosis not present

## 2023-02-23 DIAGNOSIS — E1122 Type 2 diabetes mellitus with diabetic chronic kidney disease: Secondary | ICD-10-CM | POA: Diagnosis not present

## 2023-02-23 DIAGNOSIS — R0602 Shortness of breath: Secondary | ICD-10-CM | POA: Diagnosis not present

## 2023-02-23 DIAGNOSIS — Z95810 Presence of automatic (implantable) cardiac defibrillator: Secondary | ICD-10-CM | POA: Diagnosis not present

## 2023-02-23 DIAGNOSIS — I5022 Chronic systolic (congestive) heart failure: Secondary | ICD-10-CM | POA: Diagnosis not present

## 2023-03-02 DIAGNOSIS — E1122 Type 2 diabetes mellitus with diabetic chronic kidney disease: Secondary | ICD-10-CM | POA: Diagnosis not present

## 2023-03-26 DIAGNOSIS — Z95810 Presence of automatic (implantable) cardiac defibrillator: Secondary | ICD-10-CM | POA: Diagnosis not present

## 2023-03-26 DIAGNOSIS — E1122 Type 2 diabetes mellitus with diabetic chronic kidney disease: Secondary | ICD-10-CM | POA: Diagnosis not present

## 2023-03-26 DIAGNOSIS — I5022 Chronic systolic (congestive) heart failure: Secondary | ICD-10-CM | POA: Diagnosis not present

## 2023-03-26 DIAGNOSIS — R0602 Shortness of breath: Secondary | ICD-10-CM | POA: Diagnosis not present

## 2023-03-31 DIAGNOSIS — M1712 Unilateral primary osteoarthritis, left knee: Secondary | ICD-10-CM | POA: Diagnosis not present

## 2023-04-05 DIAGNOSIS — I5022 Chronic systolic (congestive) heart failure: Secondary | ICD-10-CM | POA: Diagnosis not present

## 2023-04-07 DIAGNOSIS — Z4502 Encounter for adjustment and management of automatic implantable cardiac defibrillator: Secondary | ICD-10-CM | POA: Diagnosis not present

## 2023-04-09 DIAGNOSIS — E7849 Other hyperlipidemia: Secondary | ICD-10-CM | POA: Diagnosis not present

## 2023-04-09 DIAGNOSIS — G4733 Obstructive sleep apnea (adult) (pediatric): Secondary | ICD-10-CM | POA: Diagnosis not present

## 2023-04-09 DIAGNOSIS — E119 Type 2 diabetes mellitus without complications: Secondary | ICD-10-CM | POA: Diagnosis not present

## 2023-04-09 DIAGNOSIS — Z95810 Presence of automatic (implantable) cardiac defibrillator: Secondary | ICD-10-CM | POA: Diagnosis not present

## 2023-04-09 DIAGNOSIS — I158 Other secondary hypertension: Secondary | ICD-10-CM | POA: Diagnosis not present

## 2023-04-13 DIAGNOSIS — Z95810 Presence of automatic (implantable) cardiac defibrillator: Secondary | ICD-10-CM | POA: Diagnosis not present

## 2023-04-13 DIAGNOSIS — I5020 Unspecified systolic (congestive) heart failure: Secondary | ICD-10-CM | POA: Diagnosis not present

## 2023-04-13 DIAGNOSIS — I517 Cardiomegaly: Secondary | ICD-10-CM | POA: Diagnosis not present

## 2023-04-15 DIAGNOSIS — M1712 Unilateral primary osteoarthritis, left knee: Secondary | ICD-10-CM | POA: Diagnosis not present

## 2023-04-23 DIAGNOSIS — R0602 Shortness of breath: Secondary | ICD-10-CM | POA: Diagnosis not present

## 2023-04-23 DIAGNOSIS — Z95810 Presence of automatic (implantable) cardiac defibrillator: Secondary | ICD-10-CM | POA: Diagnosis not present

## 2023-04-23 DIAGNOSIS — E1122 Type 2 diabetes mellitus with diabetic chronic kidney disease: Secondary | ICD-10-CM | POA: Diagnosis not present

## 2023-04-23 DIAGNOSIS — I5022 Chronic systolic (congestive) heart failure: Secondary | ICD-10-CM | POA: Diagnosis not present

## 2023-04-30 DIAGNOSIS — M1712 Unilateral primary osteoarthritis, left knee: Secondary | ICD-10-CM | POA: Diagnosis not present

## 2023-05-05 DIAGNOSIS — I5022 Chronic systolic (congestive) heart failure: Secondary | ICD-10-CM | POA: Diagnosis not present

## 2023-05-05 DIAGNOSIS — D649 Anemia, unspecified: Secondary | ICD-10-CM | POA: Diagnosis not present

## 2023-05-05 DIAGNOSIS — G4733 Obstructive sleep apnea (adult) (pediatric): Secondary | ICD-10-CM | POA: Diagnosis not present

## 2023-05-05 DIAGNOSIS — I272 Pulmonary hypertension, unspecified: Secondary | ICD-10-CM | POA: Diagnosis not present

## 2023-05-05 DIAGNOSIS — I130 Hypertensive heart and chronic kidney disease with heart failure and stage 1 through stage 4 chronic kidney disease, or unspecified chronic kidney disease: Secondary | ICD-10-CM | POA: Diagnosis not present

## 2023-05-05 DIAGNOSIS — N183 Chronic kidney disease, stage 3 unspecified: Secondary | ICD-10-CM | POA: Diagnosis not present

## 2023-05-05 DIAGNOSIS — E1122 Type 2 diabetes mellitus with diabetic chronic kidney disease: Secondary | ICD-10-CM | POA: Diagnosis not present

## 2023-05-18 DIAGNOSIS — G8918 Other acute postprocedural pain: Secondary | ICD-10-CM | POA: Diagnosis not present

## 2023-05-18 DIAGNOSIS — Z96652 Presence of left artificial knee joint: Secondary | ICD-10-CM | POA: Diagnosis not present

## 2023-05-18 DIAGNOSIS — N183 Chronic kidney disease, stage 3 unspecified: Secondary | ICD-10-CM | POA: Diagnosis not present

## 2023-05-18 DIAGNOSIS — M1712 Unilateral primary osteoarthritis, left knee: Secondary | ICD-10-CM | POA: Diagnosis not present

## 2023-05-18 DIAGNOSIS — E1122 Type 2 diabetes mellitus with diabetic chronic kidney disease: Secondary | ICD-10-CM | POA: Diagnosis not present

## 2023-05-18 DIAGNOSIS — I130 Hypertensive heart and chronic kidney disease with heart failure and stage 1 through stage 4 chronic kidney disease, or unspecified chronic kidney disease: Secondary | ICD-10-CM | POA: Diagnosis not present

## 2023-05-18 DIAGNOSIS — Z471 Aftercare following joint replacement surgery: Secondary | ICD-10-CM | POA: Diagnosis not present

## 2023-05-18 DIAGNOSIS — M170 Bilateral primary osteoarthritis of knee: Secondary | ICD-10-CM | POA: Diagnosis not present

## 2023-05-19 DIAGNOSIS — M170 Bilateral primary osteoarthritis of knee: Secondary | ICD-10-CM | POA: Diagnosis not present

## 2023-05-19 DIAGNOSIS — E1122 Type 2 diabetes mellitus with diabetic chronic kidney disease: Secondary | ICD-10-CM | POA: Diagnosis not present

## 2023-05-19 DIAGNOSIS — N183 Chronic kidney disease, stage 3 unspecified: Secondary | ICD-10-CM | POA: Diagnosis not present

## 2023-05-19 DIAGNOSIS — I130 Hypertensive heart and chronic kidney disease with heart failure and stage 1 through stage 4 chronic kidney disease, or unspecified chronic kidney disease: Secondary | ICD-10-CM | POA: Diagnosis not present

## 2023-05-20 DIAGNOSIS — G4733 Obstructive sleep apnea (adult) (pediatric): Secondary | ICD-10-CM | POA: Diagnosis not present

## 2023-05-20 DIAGNOSIS — D509 Iron deficiency anemia, unspecified: Secondary | ICD-10-CM | POA: Diagnosis not present

## 2023-05-20 DIAGNOSIS — E211 Secondary hyperparathyroidism, not elsewhere classified: Secondary | ICD-10-CM | POA: Diagnosis not present

## 2023-05-20 DIAGNOSIS — M5412 Radiculopathy, cervical region: Secondary | ICD-10-CM | POA: Diagnosis not present

## 2023-05-20 DIAGNOSIS — Z794 Long term (current) use of insulin: Secondary | ICD-10-CM | POA: Diagnosis not present

## 2023-05-20 DIAGNOSIS — Z7985 Long-term (current) use of injectable non-insulin antidiabetic drugs: Secondary | ICD-10-CM | POA: Diagnosis not present

## 2023-05-20 DIAGNOSIS — N183 Chronic kidney disease, stage 3 unspecified: Secondary | ICD-10-CM | POA: Diagnosis not present

## 2023-05-20 DIAGNOSIS — M542 Cervicalgia: Secondary | ICD-10-CM | POA: Diagnosis not present

## 2023-05-20 DIAGNOSIS — Z95810 Presence of automatic (implantable) cardiac defibrillator: Secondary | ICD-10-CM | POA: Diagnosis not present

## 2023-05-20 DIAGNOSIS — M48061 Spinal stenosis, lumbar region without neurogenic claudication: Secondary | ICD-10-CM | POA: Diagnosis not present

## 2023-05-20 DIAGNOSIS — E785 Hyperlipidemia, unspecified: Secondary | ICD-10-CM | POA: Diagnosis not present

## 2023-05-20 DIAGNOSIS — Z471 Aftercare following joint replacement surgery: Secondary | ICD-10-CM | POA: Diagnosis not present

## 2023-05-20 DIAGNOSIS — Z96652 Presence of left artificial knee joint: Secondary | ICD-10-CM | POA: Diagnosis not present

## 2023-05-20 DIAGNOSIS — Z96651 Presence of right artificial knee joint: Secondary | ICD-10-CM | POA: Diagnosis not present

## 2023-05-20 DIAGNOSIS — K219 Gastro-esophageal reflux disease without esophagitis: Secondary | ICD-10-CM | POA: Diagnosis not present

## 2023-05-20 DIAGNOSIS — M5416 Radiculopathy, lumbar region: Secondary | ICD-10-CM | POA: Diagnosis not present

## 2023-05-20 DIAGNOSIS — G8929 Other chronic pain: Secondary | ICD-10-CM | POA: Diagnosis not present

## 2023-05-20 DIAGNOSIS — I5022 Chronic systolic (congestive) heart failure: Secondary | ICD-10-CM | POA: Diagnosis not present

## 2023-05-20 DIAGNOSIS — K5792 Diverticulitis of intestine, part unspecified, without perforation or abscess without bleeding: Secondary | ICD-10-CM | POA: Diagnosis not present

## 2023-05-20 DIAGNOSIS — I130 Hypertensive heart and chronic kidney disease with heart failure and stage 1 through stage 4 chronic kidney disease, or unspecified chronic kidney disease: Secondary | ICD-10-CM | POA: Diagnosis not present

## 2023-05-20 DIAGNOSIS — E1122 Type 2 diabetes mellitus with diabetic chronic kidney disease: Secondary | ICD-10-CM | POA: Diagnosis not present

## 2023-05-20 DIAGNOSIS — Z7984 Long term (current) use of oral hypoglycemic drugs: Secondary | ICD-10-CM | POA: Diagnosis not present

## 2023-05-24 DIAGNOSIS — Z95810 Presence of automatic (implantable) cardiac defibrillator: Secondary | ICD-10-CM | POA: Diagnosis not present

## 2023-05-24 DIAGNOSIS — E1122 Type 2 diabetes mellitus with diabetic chronic kidney disease: Secondary | ICD-10-CM | POA: Diagnosis not present

## 2023-05-24 DIAGNOSIS — I5022 Chronic systolic (congestive) heart failure: Secondary | ICD-10-CM | POA: Diagnosis not present

## 2023-05-24 DIAGNOSIS — R0602 Shortness of breath: Secondary | ICD-10-CM | POA: Diagnosis not present

## 2023-05-27 DIAGNOSIS — E211 Secondary hyperparathyroidism, not elsewhere classified: Secondary | ICD-10-CM | POA: Diagnosis not present

## 2023-05-27 DIAGNOSIS — E1122 Type 2 diabetes mellitus with diabetic chronic kidney disease: Secondary | ICD-10-CM | POA: Diagnosis not present

## 2023-05-27 DIAGNOSIS — Z95810 Presence of automatic (implantable) cardiac defibrillator: Secondary | ICD-10-CM | POA: Diagnosis not present

## 2023-05-27 DIAGNOSIS — G8929 Other chronic pain: Secondary | ICD-10-CM | POA: Diagnosis not present

## 2023-05-27 DIAGNOSIS — M5416 Radiculopathy, lumbar region: Secondary | ICD-10-CM | POA: Diagnosis not present

## 2023-05-27 DIAGNOSIS — G4733 Obstructive sleep apnea (adult) (pediatric): Secondary | ICD-10-CM | POA: Diagnosis not present

## 2023-05-27 DIAGNOSIS — I5022 Chronic systolic (congestive) heart failure: Secondary | ICD-10-CM | POA: Diagnosis not present

## 2023-05-27 DIAGNOSIS — M5412 Radiculopathy, cervical region: Secondary | ICD-10-CM | POA: Diagnosis not present

## 2023-05-27 DIAGNOSIS — D509 Iron deficiency anemia, unspecified: Secondary | ICD-10-CM | POA: Diagnosis not present

## 2023-05-27 DIAGNOSIS — K5792 Diverticulitis of intestine, part unspecified, without perforation or abscess without bleeding: Secondary | ICD-10-CM | POA: Diagnosis not present

## 2023-05-27 DIAGNOSIS — Z471 Aftercare following joint replacement surgery: Secondary | ICD-10-CM | POA: Diagnosis not present

## 2023-05-27 DIAGNOSIS — Z794 Long term (current) use of insulin: Secondary | ICD-10-CM | POA: Diagnosis not present

## 2023-05-27 DIAGNOSIS — Z7985 Long-term (current) use of injectable non-insulin antidiabetic drugs: Secondary | ICD-10-CM | POA: Diagnosis not present

## 2023-05-27 DIAGNOSIS — M542 Cervicalgia: Secondary | ICD-10-CM | POA: Diagnosis not present

## 2023-05-27 DIAGNOSIS — Z7984 Long term (current) use of oral hypoglycemic drugs: Secondary | ICD-10-CM | POA: Diagnosis not present

## 2023-05-27 DIAGNOSIS — N183 Chronic kidney disease, stage 3 unspecified: Secondary | ICD-10-CM | POA: Diagnosis not present

## 2023-05-27 DIAGNOSIS — K219 Gastro-esophageal reflux disease without esophagitis: Secondary | ICD-10-CM | POA: Diagnosis not present

## 2023-05-27 DIAGNOSIS — E785 Hyperlipidemia, unspecified: Secondary | ICD-10-CM | POA: Diagnosis not present

## 2023-05-27 DIAGNOSIS — Z96652 Presence of left artificial knee joint: Secondary | ICD-10-CM | POA: Diagnosis not present

## 2023-05-27 DIAGNOSIS — M48061 Spinal stenosis, lumbar region without neurogenic claudication: Secondary | ICD-10-CM | POA: Diagnosis not present

## 2023-05-27 DIAGNOSIS — Z96651 Presence of right artificial knee joint: Secondary | ICD-10-CM | POA: Diagnosis not present

## 2023-05-27 DIAGNOSIS — I130 Hypertensive heart and chronic kidney disease with heart failure and stage 1 through stage 4 chronic kidney disease, or unspecified chronic kidney disease: Secondary | ICD-10-CM | POA: Diagnosis not present

## 2023-05-28 DIAGNOSIS — Z96652 Presence of left artificial knee joint: Secondary | ICD-10-CM | POA: Diagnosis not present

## 2023-05-31 DIAGNOSIS — Z7985 Long-term (current) use of injectable non-insulin antidiabetic drugs: Secondary | ICD-10-CM | POA: Diagnosis not present

## 2023-05-31 DIAGNOSIS — G4733 Obstructive sleep apnea (adult) (pediatric): Secondary | ICD-10-CM | POA: Diagnosis not present

## 2023-05-31 DIAGNOSIS — E211 Secondary hyperparathyroidism, not elsewhere classified: Secondary | ICD-10-CM | POA: Diagnosis not present

## 2023-05-31 DIAGNOSIS — E785 Hyperlipidemia, unspecified: Secondary | ICD-10-CM | POA: Diagnosis not present

## 2023-05-31 DIAGNOSIS — K5792 Diverticulitis of intestine, part unspecified, without perforation or abscess without bleeding: Secondary | ICD-10-CM | POA: Diagnosis not present

## 2023-05-31 DIAGNOSIS — M48061 Spinal stenosis, lumbar region without neurogenic claudication: Secondary | ICD-10-CM | POA: Diagnosis not present

## 2023-05-31 DIAGNOSIS — K219 Gastro-esophageal reflux disease without esophagitis: Secondary | ICD-10-CM | POA: Diagnosis not present

## 2023-05-31 DIAGNOSIS — Z95810 Presence of automatic (implantable) cardiac defibrillator: Secondary | ICD-10-CM | POA: Diagnosis not present

## 2023-05-31 DIAGNOSIS — Z96651 Presence of right artificial knee joint: Secondary | ICD-10-CM | POA: Diagnosis not present

## 2023-05-31 DIAGNOSIS — Z794 Long term (current) use of insulin: Secondary | ICD-10-CM | POA: Diagnosis not present

## 2023-05-31 DIAGNOSIS — M5416 Radiculopathy, lumbar region: Secondary | ICD-10-CM | POA: Diagnosis not present

## 2023-05-31 DIAGNOSIS — Z471 Aftercare following joint replacement surgery: Secondary | ICD-10-CM | POA: Diagnosis not present

## 2023-05-31 DIAGNOSIS — Z96652 Presence of left artificial knee joint: Secondary | ICD-10-CM | POA: Diagnosis not present

## 2023-05-31 DIAGNOSIS — M542 Cervicalgia: Secondary | ICD-10-CM | POA: Diagnosis not present

## 2023-05-31 DIAGNOSIS — N183 Chronic kidney disease, stage 3 unspecified: Secondary | ICD-10-CM | POA: Diagnosis not present

## 2023-05-31 DIAGNOSIS — I5022 Chronic systolic (congestive) heart failure: Secondary | ICD-10-CM | POA: Diagnosis not present

## 2023-05-31 DIAGNOSIS — M5412 Radiculopathy, cervical region: Secondary | ICD-10-CM | POA: Diagnosis not present

## 2023-05-31 DIAGNOSIS — Z7984 Long term (current) use of oral hypoglycemic drugs: Secondary | ICD-10-CM | POA: Diagnosis not present

## 2023-05-31 DIAGNOSIS — D509 Iron deficiency anemia, unspecified: Secondary | ICD-10-CM | POA: Diagnosis not present

## 2023-05-31 DIAGNOSIS — G8929 Other chronic pain: Secondary | ICD-10-CM | POA: Diagnosis not present

## 2023-05-31 DIAGNOSIS — E1122 Type 2 diabetes mellitus with diabetic chronic kidney disease: Secondary | ICD-10-CM | POA: Diagnosis not present

## 2023-05-31 DIAGNOSIS — I130 Hypertensive heart and chronic kidney disease with heart failure and stage 1 through stage 4 chronic kidney disease, or unspecified chronic kidney disease: Secondary | ICD-10-CM | POA: Diagnosis not present

## 2023-06-01 DIAGNOSIS — M25562 Pain in left knee: Secondary | ICD-10-CM | POA: Diagnosis not present

## 2023-06-01 DIAGNOSIS — E1122 Type 2 diabetes mellitus with diabetic chronic kidney disease: Secondary | ICD-10-CM | POA: Diagnosis not present

## 2023-06-01 DIAGNOSIS — R55 Syncope and collapse: Secondary | ICD-10-CM | POA: Diagnosis not present

## 2023-06-01 DIAGNOSIS — S0990XA Unspecified injury of head, initial encounter: Secondary | ICD-10-CM | POA: Diagnosis not present

## 2023-06-01 DIAGNOSIS — M25522 Pain in left elbow: Secondary | ICD-10-CM | POA: Diagnosis not present

## 2023-06-01 DIAGNOSIS — K5903 Drug induced constipation: Secondary | ICD-10-CM | POA: Diagnosis not present

## 2023-06-02 DIAGNOSIS — M5412 Radiculopathy, cervical region: Secondary | ICD-10-CM | POA: Diagnosis not present

## 2023-06-02 DIAGNOSIS — G8929 Other chronic pain: Secondary | ICD-10-CM | POA: Diagnosis not present

## 2023-06-02 DIAGNOSIS — E1122 Type 2 diabetes mellitus with diabetic chronic kidney disease: Secondary | ICD-10-CM | POA: Diagnosis not present

## 2023-06-02 DIAGNOSIS — D509 Iron deficiency anemia, unspecified: Secondary | ICD-10-CM | POA: Diagnosis not present

## 2023-06-02 DIAGNOSIS — N183 Chronic kidney disease, stage 3 unspecified: Secondary | ICD-10-CM | POA: Diagnosis not present

## 2023-06-02 DIAGNOSIS — E211 Secondary hyperparathyroidism, not elsewhere classified: Secondary | ICD-10-CM | POA: Diagnosis not present

## 2023-06-02 DIAGNOSIS — Z471 Aftercare following joint replacement surgery: Secondary | ICD-10-CM | POA: Diagnosis not present

## 2023-06-02 DIAGNOSIS — M542 Cervicalgia: Secondary | ICD-10-CM | POA: Diagnosis not present

## 2023-06-02 DIAGNOSIS — Z96652 Presence of left artificial knee joint: Secondary | ICD-10-CM | POA: Diagnosis not present

## 2023-06-02 DIAGNOSIS — Z95810 Presence of automatic (implantable) cardiac defibrillator: Secondary | ICD-10-CM | POA: Diagnosis not present

## 2023-06-02 DIAGNOSIS — I130 Hypertensive heart and chronic kidney disease with heart failure and stage 1 through stage 4 chronic kidney disease, or unspecified chronic kidney disease: Secondary | ICD-10-CM | POA: Diagnosis not present

## 2023-06-02 DIAGNOSIS — Z7984 Long term (current) use of oral hypoglycemic drugs: Secondary | ICD-10-CM | POA: Diagnosis not present

## 2023-06-02 DIAGNOSIS — K219 Gastro-esophageal reflux disease without esophagitis: Secondary | ICD-10-CM | POA: Diagnosis not present

## 2023-06-02 DIAGNOSIS — I5022 Chronic systolic (congestive) heart failure: Secondary | ICD-10-CM | POA: Diagnosis not present

## 2023-06-02 DIAGNOSIS — Z7985 Long-term (current) use of injectable non-insulin antidiabetic drugs: Secondary | ICD-10-CM | POA: Diagnosis not present

## 2023-06-02 DIAGNOSIS — M48061 Spinal stenosis, lumbar region without neurogenic claudication: Secondary | ICD-10-CM | POA: Diagnosis not present

## 2023-06-02 DIAGNOSIS — Z96651 Presence of right artificial knee joint: Secondary | ICD-10-CM | POA: Diagnosis not present

## 2023-06-02 DIAGNOSIS — M5416 Radiculopathy, lumbar region: Secondary | ICD-10-CM | POA: Diagnosis not present

## 2023-06-02 DIAGNOSIS — K5792 Diverticulitis of intestine, part unspecified, without perforation or abscess without bleeding: Secondary | ICD-10-CM | POA: Diagnosis not present

## 2023-06-02 DIAGNOSIS — Z794 Long term (current) use of insulin: Secondary | ICD-10-CM | POA: Diagnosis not present

## 2023-06-02 DIAGNOSIS — E785 Hyperlipidemia, unspecified: Secondary | ICD-10-CM | POA: Diagnosis not present

## 2023-06-02 DIAGNOSIS — G4733 Obstructive sleep apnea (adult) (pediatric): Secondary | ICD-10-CM | POA: Diagnosis not present

## 2023-06-09 DIAGNOSIS — M5412 Radiculopathy, cervical region: Secondary | ICD-10-CM | POA: Diagnosis not present

## 2023-06-09 DIAGNOSIS — G8929 Other chronic pain: Secondary | ICD-10-CM | POA: Diagnosis not present

## 2023-06-09 DIAGNOSIS — Z7985 Long-term (current) use of injectable non-insulin antidiabetic drugs: Secondary | ICD-10-CM | POA: Diagnosis not present

## 2023-06-09 DIAGNOSIS — N183 Chronic kidney disease, stage 3 unspecified: Secondary | ICD-10-CM | POA: Diagnosis not present

## 2023-06-09 DIAGNOSIS — Z95810 Presence of automatic (implantable) cardiac defibrillator: Secondary | ICD-10-CM | POA: Diagnosis not present

## 2023-06-09 DIAGNOSIS — G4733 Obstructive sleep apnea (adult) (pediatric): Secondary | ICD-10-CM | POA: Diagnosis not present

## 2023-06-09 DIAGNOSIS — E1122 Type 2 diabetes mellitus with diabetic chronic kidney disease: Secondary | ICD-10-CM | POA: Diagnosis not present

## 2023-06-09 DIAGNOSIS — Z7984 Long term (current) use of oral hypoglycemic drugs: Secondary | ICD-10-CM | POA: Diagnosis not present

## 2023-06-09 DIAGNOSIS — Z471 Aftercare following joint replacement surgery: Secondary | ICD-10-CM | POA: Diagnosis not present

## 2023-06-09 DIAGNOSIS — Z794 Long term (current) use of insulin: Secondary | ICD-10-CM | POA: Diagnosis not present

## 2023-06-09 DIAGNOSIS — I5022 Chronic systolic (congestive) heart failure: Secondary | ICD-10-CM | POA: Diagnosis not present

## 2023-06-09 DIAGNOSIS — K5792 Diverticulitis of intestine, part unspecified, without perforation or abscess without bleeding: Secondary | ICD-10-CM | POA: Diagnosis not present

## 2023-06-09 DIAGNOSIS — D509 Iron deficiency anemia, unspecified: Secondary | ICD-10-CM | POA: Diagnosis not present

## 2023-06-09 DIAGNOSIS — Z96652 Presence of left artificial knee joint: Secondary | ICD-10-CM | POA: Diagnosis not present

## 2023-06-09 DIAGNOSIS — M5416 Radiculopathy, lumbar region: Secondary | ICD-10-CM | POA: Diagnosis not present

## 2023-06-09 DIAGNOSIS — E211 Secondary hyperparathyroidism, not elsewhere classified: Secondary | ICD-10-CM | POA: Diagnosis not present

## 2023-06-09 DIAGNOSIS — Z96651 Presence of right artificial knee joint: Secondary | ICD-10-CM | POA: Diagnosis not present

## 2023-06-09 DIAGNOSIS — K219 Gastro-esophageal reflux disease without esophagitis: Secondary | ICD-10-CM | POA: Diagnosis not present

## 2023-06-09 DIAGNOSIS — M48061 Spinal stenosis, lumbar region without neurogenic claudication: Secondary | ICD-10-CM | POA: Diagnosis not present

## 2023-06-09 DIAGNOSIS — E785 Hyperlipidemia, unspecified: Secondary | ICD-10-CM | POA: Diagnosis not present

## 2023-06-09 DIAGNOSIS — I130 Hypertensive heart and chronic kidney disease with heart failure and stage 1 through stage 4 chronic kidney disease, or unspecified chronic kidney disease: Secondary | ICD-10-CM | POA: Diagnosis not present

## 2023-06-09 DIAGNOSIS — M542 Cervicalgia: Secondary | ICD-10-CM | POA: Diagnosis not present

## 2023-06-11 DIAGNOSIS — Z471 Aftercare following joint replacement surgery: Secondary | ICD-10-CM | POA: Diagnosis not present

## 2023-06-11 DIAGNOSIS — Z96652 Presence of left artificial knee joint: Secondary | ICD-10-CM | POA: Diagnosis not present

## 2023-06-21 DIAGNOSIS — Z96652 Presence of left artificial knee joint: Secondary | ICD-10-CM | POA: Diagnosis not present

## 2023-06-23 DIAGNOSIS — Z95810 Presence of automatic (implantable) cardiac defibrillator: Secondary | ICD-10-CM | POA: Diagnosis not present

## 2023-06-23 DIAGNOSIS — E1122 Type 2 diabetes mellitus with diabetic chronic kidney disease: Secondary | ICD-10-CM | POA: Diagnosis not present

## 2023-06-23 DIAGNOSIS — R0602 Shortness of breath: Secondary | ICD-10-CM | POA: Diagnosis not present

## 2023-06-23 DIAGNOSIS — I5022 Chronic systolic (congestive) heart failure: Secondary | ICD-10-CM | POA: Diagnosis not present

## 2023-06-30 DIAGNOSIS — N1832 Chronic kidney disease, stage 3b: Secondary | ICD-10-CM | POA: Diagnosis not present

## 2023-07-05 DIAGNOSIS — I5022 Chronic systolic (congestive) heart failure: Secondary | ICD-10-CM | POA: Diagnosis not present

## 2023-07-06 DIAGNOSIS — N2581 Secondary hyperparathyroidism of renal origin: Secondary | ICD-10-CM | POA: Diagnosis not present

## 2023-07-06 DIAGNOSIS — N1832 Chronic kidney disease, stage 3b: Secondary | ICD-10-CM | POA: Diagnosis not present

## 2023-07-06 DIAGNOSIS — I129 Hypertensive chronic kidney disease with stage 1 through stage 4 chronic kidney disease, or unspecified chronic kidney disease: Secondary | ICD-10-CM | POA: Diagnosis not present

## 2023-07-24 DIAGNOSIS — E1122 Type 2 diabetes mellitus with diabetic chronic kidney disease: Secondary | ICD-10-CM | POA: Diagnosis not present

## 2023-07-24 DIAGNOSIS — I5022 Chronic systolic (congestive) heart failure: Secondary | ICD-10-CM | POA: Diagnosis not present

## 2023-07-24 DIAGNOSIS — R0602 Shortness of breath: Secondary | ICD-10-CM | POA: Diagnosis not present

## 2023-07-24 DIAGNOSIS — Z95810 Presence of automatic (implantable) cardiac defibrillator: Secondary | ICD-10-CM | POA: Diagnosis not present

## 2023-07-28 DIAGNOSIS — K219 Gastro-esophageal reflux disease without esophagitis: Secondary | ICD-10-CM | POA: Diagnosis not present

## 2023-07-28 DIAGNOSIS — Z794 Long term (current) use of insulin: Secondary | ICD-10-CM | POA: Diagnosis not present

## 2023-07-28 DIAGNOSIS — E7849 Other hyperlipidemia: Secondary | ICD-10-CM | POA: Diagnosis not present

## 2023-07-28 DIAGNOSIS — I129 Hypertensive chronic kidney disease with stage 1 through stage 4 chronic kidney disease, or unspecified chronic kidney disease: Secondary | ICD-10-CM | POA: Diagnosis not present

## 2023-07-28 DIAGNOSIS — E1122 Type 2 diabetes mellitus with diabetic chronic kidney disease: Secondary | ICD-10-CM | POA: Diagnosis not present

## 2023-07-28 DIAGNOSIS — N183 Chronic kidney disease, stage 3 unspecified: Secondary | ICD-10-CM | POA: Diagnosis not present

## 2023-07-28 DIAGNOSIS — R1310 Dysphagia, unspecified: Secondary | ICD-10-CM | POA: Diagnosis not present

## 2023-08-02 DIAGNOSIS — N183 Chronic kidney disease, stage 3 unspecified: Secondary | ICD-10-CM | POA: Diagnosis not present

## 2023-08-02 DIAGNOSIS — Z794 Long term (current) use of insulin: Secondary | ICD-10-CM | POA: Diagnosis not present

## 2023-08-02 DIAGNOSIS — E1122 Type 2 diabetes mellitus with diabetic chronic kidney disease: Secondary | ICD-10-CM | POA: Diagnosis not present

## 2023-08-02 DIAGNOSIS — R5383 Other fatigue: Secondary | ICD-10-CM | POA: Diagnosis not present

## 2023-08-04 DIAGNOSIS — Z96652 Presence of left artificial knee joint: Secondary | ICD-10-CM | POA: Diagnosis not present

## 2023-08-10 DIAGNOSIS — E871 Hypo-osmolality and hyponatremia: Secondary | ICD-10-CM | POA: Diagnosis not present

## 2023-08-12 DIAGNOSIS — R634 Abnormal weight loss: Secondary | ICD-10-CM | POA: Diagnosis not present

## 2023-08-12 DIAGNOSIS — R1319 Other dysphagia: Secondary | ICD-10-CM | POA: Diagnosis not present

## 2023-08-12 DIAGNOSIS — K219 Gastro-esophageal reflux disease without esophagitis: Secondary | ICD-10-CM | POA: Diagnosis not present

## 2023-08-18 DIAGNOSIS — K2950 Unspecified chronic gastritis without bleeding: Secondary | ICD-10-CM | POA: Diagnosis not present

## 2023-08-18 DIAGNOSIS — N183 Chronic kidney disease, stage 3 unspecified: Secondary | ICD-10-CM | POA: Diagnosis not present

## 2023-08-18 DIAGNOSIS — Z7985 Long-term (current) use of injectable non-insulin antidiabetic drugs: Secondary | ICD-10-CM | POA: Diagnosis not present

## 2023-08-18 DIAGNOSIS — I5022 Chronic systolic (congestive) heart failure: Secondary | ICD-10-CM | POA: Diagnosis not present

## 2023-08-18 DIAGNOSIS — K219 Gastro-esophageal reflux disease without esophagitis: Secondary | ICD-10-CM | POA: Diagnosis not present

## 2023-08-18 DIAGNOSIS — I130 Hypertensive heart and chronic kidney disease with heart failure and stage 1 through stage 4 chronic kidney disease, or unspecified chronic kidney disease: Secondary | ICD-10-CM | POA: Diagnosis not present

## 2023-08-18 DIAGNOSIS — Z79899 Other long term (current) drug therapy: Secondary | ICD-10-CM | POA: Diagnosis not present

## 2023-08-18 DIAGNOSIS — Z7982 Long term (current) use of aspirin: Secondary | ICD-10-CM | POA: Diagnosis not present

## 2023-08-18 DIAGNOSIS — E1122 Type 2 diabetes mellitus with diabetic chronic kidney disease: Secondary | ICD-10-CM | POA: Diagnosis not present

## 2023-08-18 DIAGNOSIS — K3189 Other diseases of stomach and duodenum: Secondary | ICD-10-CM | POA: Diagnosis not present

## 2023-08-18 DIAGNOSIS — Z794 Long term (current) use of insulin: Secondary | ICD-10-CM | POA: Diagnosis not present

## 2023-08-23 DIAGNOSIS — I5022 Chronic systolic (congestive) heart failure: Secondary | ICD-10-CM | POA: Diagnosis not present

## 2023-08-23 DIAGNOSIS — R0602 Shortness of breath: Secondary | ICD-10-CM | POA: Diagnosis not present

## 2023-08-23 DIAGNOSIS — Z95810 Presence of automatic (implantable) cardiac defibrillator: Secondary | ICD-10-CM | POA: Diagnosis not present

## 2023-08-23 DIAGNOSIS — E1122 Type 2 diabetes mellitus with diabetic chronic kidney disease: Secondary | ICD-10-CM | POA: Diagnosis not present

## 2023-08-31 ENCOUNTER — Telehealth: Payer: Self-pay

## 2023-08-31 NOTE — Telephone Encounter (Signed)
 Patient was identified as falling into the [place] Measure - Diabetes.   Patient was: Patient is not currently using our practice.

## 2023-09-23 DIAGNOSIS — I5022 Chronic systolic (congestive) heart failure: Secondary | ICD-10-CM | POA: Diagnosis not present

## 2023-09-23 DIAGNOSIS — Z95810 Presence of automatic (implantable) cardiac defibrillator: Secondary | ICD-10-CM | POA: Diagnosis not present

## 2023-09-23 DIAGNOSIS — R0602 Shortness of breath: Secondary | ICD-10-CM | POA: Diagnosis not present

## 2023-09-23 DIAGNOSIS — E1122 Type 2 diabetes mellitus with diabetic chronic kidney disease: Secondary | ICD-10-CM | POA: Diagnosis not present

## 2023-10-05 DIAGNOSIS — Z95810 Presence of automatic (implantable) cardiac defibrillator: Secondary | ICD-10-CM | POA: Diagnosis not present

## 2023-10-13 DIAGNOSIS — S46011A Strain of muscle(s) and tendon(s) of the rotator cuff of right shoulder, initial encounter: Secondary | ICD-10-CM | POA: Diagnosis not present

## 2023-10-24 DIAGNOSIS — E1122 Type 2 diabetes mellitus with diabetic chronic kidney disease: Secondary | ICD-10-CM | POA: Diagnosis not present

## 2023-10-24 DIAGNOSIS — Z95810 Presence of automatic (implantable) cardiac defibrillator: Secondary | ICD-10-CM | POA: Diagnosis not present

## 2023-10-24 DIAGNOSIS — R0602 Shortness of breath: Secondary | ICD-10-CM | POA: Diagnosis not present

## 2023-10-24 DIAGNOSIS — I5022 Chronic systolic (congestive) heart failure: Secondary | ICD-10-CM | POA: Diagnosis not present

## 2023-10-27 DIAGNOSIS — M25511 Pain in right shoulder: Secondary | ICD-10-CM | POA: Diagnosis not present

## 2023-10-27 DIAGNOSIS — S46011A Strain of muscle(s) and tendon(s) of the rotator cuff of right shoulder, initial encounter: Secondary | ICD-10-CM | POA: Diagnosis not present

## 2024-03-03 ENCOUNTER — Encounter: Payer: Self-pay | Admitting: *Deleted

## 2024-03-03 NOTE — Progress Notes (Signed)
 Veronica Conner                                          MRN: [id number]   03/03/2024   The [facility] Quality Team Specialist reviewed this patient medical record for the purposes of chart review for care gap closure. The following were reviewed: chart review for care gap closure-glycemic status assessment.    [facility] Quality Team
